# Patient Record
Sex: Female | Born: 1955 | Race: White | Hispanic: No | Marital: Married | Smoking: Former smoker
Health system: Southern US, Community
[De-identification: ages and names within clinical notes are randomized; demographics above are authoritative.]

## PROBLEM LIST (undated history)

## (undated) DIAGNOSIS — E119 Type 2 diabetes mellitus without complications: Secondary | ICD-10-CM

## (undated) DIAGNOSIS — K219 Gastro-esophageal reflux disease without esophagitis: Secondary | ICD-10-CM

## (undated) DIAGNOSIS — J309 Allergic rhinitis, unspecified: Secondary | ICD-10-CM

## (undated) DIAGNOSIS — E559 Vitamin D deficiency, unspecified: Secondary | ICD-10-CM

## (undated) DIAGNOSIS — G4730 Sleep apnea, unspecified: Secondary | ICD-10-CM

## (undated) DIAGNOSIS — D649 Anemia, unspecified: Secondary | ICD-10-CM

## (undated) DIAGNOSIS — Z8249 Family history of ischemic heart disease and other diseases of the circulatory system: Secondary | ICD-10-CM

## (undated) DIAGNOSIS — F32A Depression, unspecified: Secondary | ICD-10-CM

## (undated) DIAGNOSIS — E785 Hyperlipidemia, unspecified: Secondary | ICD-10-CM

## (undated) DIAGNOSIS — I10 Essential (primary) hypertension: Secondary | ICD-10-CM

## (undated) DIAGNOSIS — F329 Major depressive disorder, single episode, unspecified: Secondary | ICD-10-CM

## (undated) DIAGNOSIS — I2510 Atherosclerotic heart disease of native coronary artery without angina pectoris: Secondary | ICD-10-CM

## (undated) DIAGNOSIS — I209 Angina pectoris, unspecified: Secondary | ICD-10-CM

## (undated) DIAGNOSIS — M8580 Other specified disorders of bone density and structure, unspecified site: Secondary | ICD-10-CM

## (undated) DIAGNOSIS — K5090 Crohn's disease, unspecified, without complications: Secondary | ICD-10-CM

## (undated) DIAGNOSIS — M1990 Unspecified osteoarthritis, unspecified site: Secondary | ICD-10-CM

## (undated) HISTORY — DX: Hyperlipidemia, unspecified: E78.5

## (undated) HISTORY — DX: Other specified disorders of bone density and structure, unspecified site: M85.80

## (undated) HISTORY — DX: Family history of ischemic heart disease and other diseases of the circulatory system: Z82.49

## (undated) HISTORY — DX: Type 2 diabetes mellitus without complications: E11.9

## (undated) HISTORY — DX: Vitamin D deficiency, unspecified: E55.9

## (undated) HISTORY — DX: Anemia, unspecified: D64.9

## (undated) HISTORY — DX: Sleep apnea, unspecified: G47.30

## (undated) HISTORY — DX: Crohn's disease, unspecified, without complications: K50.90

## (undated) HISTORY — DX: Essential (primary) hypertension: I10

## (undated) HISTORY — PX: CARDIAC CATHETERIZATION: SHX172

## (undated) HISTORY — DX: Allergic rhinitis, unspecified: J30.9

## (undated) HISTORY — DX: Depression, unspecified: F32.A

## (undated) HISTORY — DX: Major depressive disorder, single episode, unspecified: F32.9

---

## 1998-05-05 ENCOUNTER — Ambulatory Visit (HOSPITAL_COMMUNITY): Admission: RE | Admit: 1998-05-05 | Discharge: 1998-05-05 | Payer: Self-pay | Admitting: Obstetrics and Gynecology

## 1998-05-08 ENCOUNTER — Ambulatory Visit (HOSPITAL_COMMUNITY): Admission: RE | Admit: 1998-05-08 | Discharge: 1998-05-08 | Payer: Self-pay | Admitting: Obstetrics and Gynecology

## 1999-05-26 ENCOUNTER — Encounter: Payer: Self-pay | Admitting: Obstetrics and Gynecology

## 1999-05-26 ENCOUNTER — Ambulatory Visit (HOSPITAL_COMMUNITY): Admission: RE | Admit: 1999-05-26 | Discharge: 1999-05-26 | Payer: Self-pay | Admitting: Obstetrics and Gynecology

## 1999-08-23 HISTORY — PX: CHOLECYSTECTOMY, LAPAROSCOPIC: SHX56

## 2000-07-04 ENCOUNTER — Ambulatory Visit (HOSPITAL_COMMUNITY): Admission: RE | Admit: 2000-07-04 | Discharge: 2000-07-04 | Payer: Self-pay | Admitting: Obstetrics and Gynecology

## 2000-07-04 ENCOUNTER — Encounter: Payer: Self-pay | Admitting: Obstetrics and Gynecology

## 2001-02-13 ENCOUNTER — Other Ambulatory Visit: Admission: RE | Admit: 2001-02-13 | Discharge: 2001-02-13 | Payer: Self-pay | Admitting: Obstetrics and Gynecology

## 2001-09-20 ENCOUNTER — Ambulatory Visit (HOSPITAL_COMMUNITY): Admission: RE | Admit: 2001-09-20 | Discharge: 2001-09-20 | Payer: Self-pay | Admitting: Obstetrics and Gynecology

## 2001-09-20 ENCOUNTER — Encounter: Payer: Self-pay | Admitting: Obstetrics and Gynecology

## 2002-04-29 ENCOUNTER — Other Ambulatory Visit: Admission: RE | Admit: 2002-04-29 | Discharge: 2002-04-29 | Payer: Self-pay | Admitting: Obstetrics and Gynecology

## 2002-05-13 ENCOUNTER — Emergency Department (HOSPITAL_COMMUNITY): Admission: EM | Admit: 2002-05-13 | Discharge: 2002-05-13 | Payer: Self-pay | Admitting: Emergency Medicine

## 2002-05-13 ENCOUNTER — Encounter: Payer: Self-pay | Admitting: Emergency Medicine

## 2002-05-17 ENCOUNTER — Observation Stay (HOSPITAL_COMMUNITY): Admission: RE | Admit: 2002-05-17 | Discharge: 2002-05-17 | Payer: Self-pay | Admitting: *Deleted

## 2002-05-17 ENCOUNTER — Encounter (INDEPENDENT_AMBULATORY_CARE_PROVIDER_SITE_OTHER): Payer: Self-pay | Admitting: Specialist

## 2002-10-07 ENCOUNTER — Ambulatory Visit (HOSPITAL_COMMUNITY): Admission: RE | Admit: 2002-10-07 | Discharge: 2002-10-07 | Payer: Self-pay | Admitting: Obstetrics and Gynecology

## 2002-10-07 ENCOUNTER — Encounter: Payer: Self-pay | Admitting: Obstetrics and Gynecology

## 2003-05-20 ENCOUNTER — Other Ambulatory Visit: Admission: RE | Admit: 2003-05-20 | Discharge: 2003-05-20 | Payer: Self-pay | Admitting: Obstetrics and Gynecology

## 2003-10-13 ENCOUNTER — Ambulatory Visit (HOSPITAL_COMMUNITY): Admission: RE | Admit: 2003-10-13 | Discharge: 2003-10-13 | Payer: Self-pay | Admitting: Obstetrics and Gynecology

## 2004-06-25 ENCOUNTER — Ambulatory Visit: Payer: Self-pay | Admitting: Internal Medicine

## 2004-07-23 ENCOUNTER — Ambulatory Visit: Payer: Self-pay | Admitting: Internal Medicine

## 2004-08-10 ENCOUNTER — Ambulatory Visit (HOSPITAL_COMMUNITY): Admission: RE | Admit: 2004-08-10 | Discharge: 2004-08-10 | Payer: Self-pay | Admitting: Internal Medicine

## 2004-10-08 ENCOUNTER — Ambulatory Visit: Payer: Self-pay | Admitting: Internal Medicine

## 2004-10-18 ENCOUNTER — Encounter: Admission: RE | Admit: 2004-10-18 | Discharge: 2005-01-16 | Payer: Self-pay | Admitting: Internal Medicine

## 2004-12-06 ENCOUNTER — Ambulatory Visit (HOSPITAL_COMMUNITY): Admission: RE | Admit: 2004-12-06 | Discharge: 2004-12-06 | Payer: Self-pay | Admitting: Obstetrics and Gynecology

## 2004-12-17 ENCOUNTER — Encounter: Admission: RE | Admit: 2004-12-17 | Discharge: 2004-12-17 | Payer: Self-pay | Admitting: Obstetrics and Gynecology

## 2006-07-12 ENCOUNTER — Encounter: Admission: RE | Admit: 2006-07-12 | Discharge: 2006-07-12 | Payer: Self-pay | Admitting: Family Medicine

## 2006-11-14 ENCOUNTER — Other Ambulatory Visit: Admission: RE | Admit: 2006-11-14 | Discharge: 2006-11-14 | Payer: Self-pay | Admitting: Family Medicine

## 2006-11-30 ENCOUNTER — Ambulatory Visit: Payer: Self-pay | Admitting: Cardiology

## 2006-12-06 ENCOUNTER — Ambulatory Visit: Payer: Self-pay

## 2006-12-26 ENCOUNTER — Ambulatory Visit: Payer: Self-pay | Admitting: Cardiology

## 2007-01-04 ENCOUNTER — Ambulatory Visit: Payer: Self-pay | Admitting: Cardiology

## 2007-01-04 LAB — CONVERTED CEMR LAB
ALT: 31 units/L (ref 0–40)
AST: 16 units/L (ref 0–37)
Bilirubin, Direct: 0.1 mg/dL (ref 0.0–0.3)
CO2: 28 meq/L (ref 19–32)
Calcium: 9.3 mg/dL (ref 8.4–10.5)
Chloride: 105 meq/L (ref 96–112)
Cholesterol: 171 mg/dL (ref 0–200)
Creatinine, Ser: 0.8 mg/dL (ref 0.4–1.2)
Glucose, Bld: 119 mg/dL — ABNORMAL HIGH (ref 70–99)
Total Bilirubin: 0.7 mg/dL (ref 0.3–1.2)
Total CHOL/HDL Ratio: 4
Total Protein: 7.2 g/dL (ref 6.0–8.3)

## 2007-02-14 ENCOUNTER — Ambulatory Visit: Payer: Self-pay | Admitting: Cardiology

## 2007-06-28 ENCOUNTER — Ambulatory Visit: Payer: Self-pay | Admitting: Cardiology

## 2007-07-23 ENCOUNTER — Ambulatory Visit: Payer: Self-pay | Admitting: Cardiology

## 2007-07-23 LAB — CONVERTED CEMR LAB
AST: 10 units/L (ref 0–37)
Albumin: 3.8 g/dL (ref 3.5–5.2)
Alkaline Phosphatase: 66 units/L (ref 39–117)
Total Bilirubin: 0.7 mg/dL (ref 0.3–1.2)

## 2007-09-26 ENCOUNTER — Encounter: Admission: RE | Admit: 2007-09-26 | Discharge: 2007-09-26 | Payer: Self-pay | Admitting: Family Medicine

## 2008-07-15 ENCOUNTER — Ambulatory Visit: Payer: Self-pay | Admitting: Cardiology

## 2008-07-15 LAB — CONVERTED CEMR LAB
AST: 20 units/L (ref 0–37)
Chloride: 106 meq/L (ref 96–112)
Creatinine, Ser: 0.7 mg/dL (ref 0.4–1.2)
GFR calc Af Amer: 112 mL/min
GFR calc non Af Amer: 93 mL/min
LDL Cholesterol: 93 mg/dL (ref 0–99)
Potassium: 4.6 meq/L (ref 3.5–5.1)
Total Bilirubin: 0.7 mg/dL (ref 0.3–1.2)
Total CHOL/HDL Ratio: 4.3
VLDL: 35 mg/dL (ref 0–40)

## 2008-08-28 ENCOUNTER — Ambulatory Visit: Payer: Self-pay | Admitting: Cardiology

## 2009-04-30 ENCOUNTER — Ambulatory Visit (HOSPITAL_COMMUNITY): Admission: RE | Admit: 2009-04-30 | Discharge: 2009-04-30 | Payer: Self-pay | Admitting: Family Medicine

## 2009-05-11 ENCOUNTER — Other Ambulatory Visit: Admission: RE | Admit: 2009-05-11 | Discharge: 2009-05-11 | Payer: Self-pay | Admitting: Family Medicine

## 2009-06-12 ENCOUNTER — Encounter (INDEPENDENT_AMBULATORY_CARE_PROVIDER_SITE_OTHER): Payer: Self-pay | Admitting: *Deleted

## 2009-09-03 ENCOUNTER — Encounter (INDEPENDENT_AMBULATORY_CARE_PROVIDER_SITE_OTHER): Payer: Self-pay | Admitting: *Deleted

## 2009-12-21 ENCOUNTER — Telehealth: Payer: Self-pay | Admitting: Internal Medicine

## 2010-04-22 HISTORY — PX: HEMORRHOID SURGERY: SHX153

## 2010-05-17 ENCOUNTER — Encounter: Payer: Self-pay | Admitting: Cardiology

## 2010-05-18 ENCOUNTER — Encounter: Payer: Self-pay | Admitting: Cardiology

## 2010-07-22 ENCOUNTER — Ambulatory Visit (HOSPITAL_COMMUNITY)
Admission: RE | Admit: 2010-07-22 | Discharge: 2010-07-22 | Payer: Self-pay | Source: Home / Self Care | Admitting: Family Medicine

## 2010-09-09 ENCOUNTER — Other Ambulatory Visit: Payer: Self-pay | Admitting: Dermatology

## 2010-09-12 ENCOUNTER — Encounter: Payer: Self-pay | Admitting: Family Medicine

## 2010-09-23 NOTE — Progress Notes (Signed)
Summary: Schedule Colonoscopy   Phone Note Outgoing Call Call back at [facility] Phone [phone]   Call placed by: Bernita Buffy CMA Deborra Medina),  Dec 21, 2009 2:54 PM Call placed to: Patient Summary of Call: Left message on patients machine to call back. pt needs colonoscopy for crohns.  Initial call taken by: Bernita Buffy CMA Deborra Medina),  Dec 21, 2009 2:54 PM  Follow-up for Phone Call        Left message on patients machine to call back. patient due for colonoscopy. we will mail her a letter as a reminder.  Follow-up by: Bernita Buffy CMA ([place]),  Jan 01, 2010 11:02 AM

## 2010-09-23 NOTE — Miscellaneous (Signed)
Summary: update med  Clinical Lists Changes  Medications: Added new medication of LISINOPRIL 20 MG TABS (LISINOPRIL) Take one tablet by mouth daily

## 2011-01-04 NOTE — Assessment & Plan Note (Signed)
Summer Hodge                            CARDIOLOGY OFFICE NOTE   Summer Hodge, Summer Hodge                       MRN:          [id number]  DATE:02/14/2007                            DOB:          1956/08/21    PRIMARY CARDIOLOGIST:  Summer Hodge. Summer Foyer, MD, [facility].   PRIMARY CARE Summer Hodge:  Summer Hodge, M.D.   PATIENT PROFILE:  A 55 year old Caucasian female with history of  hypertension, hyperlipidemia, and chest pain, status post recent  negative Myoview, who presents for followup on hypertension.   PROBLEMS:  1. Chest pain.      a.     On December 06, 2006, exercise Myoview.  Exercise time 9       minutes.  Maximum heart rate 151 beats per minute.  Hypertensive       response to exercise, with maximum blood pressure of 213/99.       Nonspecific ST-T wave changes.  EF 73%.  There was a fixed       anterior defect that is suggestive of breast attenuation.  No       ischemia.  Felt to be a limited study.  2. Hypertension.  3. Hyperlipidemia.      a.     On Jan 04, 2007, total cholesterol 171, triglycerides 164,       HDL 43.1, LDL 95.  LFTs were within normal limits at that time.  4. History of mild Crohn's disease.  5. Obstructive sleep apnea, on CPAP.  6. History of cesarean section in 1993.  7. Status post cholecystectomy in 2003.   HISTORY OF PRESENT ILLNESS:  A 55 year old Caucasian female, recently  evaluated by Dr. Lia Hodge for chest pain, with subsequent Myoview which  was negative.  She was noted during Myoview to have a hypertensive  response to exercise, and in followup was placed on lisinopril 10 mg  daily.  She has tracked her blood pressure intermittently over the past  month, with pressures ranging from a low of 116 to a high of 151V  systolically.  She has not had any recurrent chest pain or shortness of  breath.  She denies any PND, orthopnea, dizziness, syncope, edema, or  early satiety.   HOME MEDICATIONS:  1. Aspirin 81 mg daily.  2. Lisinopril 10 mg daily.  3. Crestor 10 mg daily.   PHYSICAL EXAMINATION:  VITAL SIGNS:  Blood pressure 138/87, heart rate  75, respirations 16, weight is 194 pounds, which is unchanged from one  month ago.  GENERAL:  Pleasant white female in no acute distress.  Awake, alert, and  oriented x3.  NECK:  Normal carotid upstrokes.  No bruits or JVD.  LUNGS:  Respirations regular and unlabored.  Clear to auscultation.  CARDIAC:  Regular S1, S2.  No S3, S4, or murmurs.  ABDOMEN:  Round, soft, nontender, nondistended.  Bowel sounds present  x4.  EXTREMITIES:  Warm, dry, pink.  No clubbing, cyanosis, or edema.  Dorsalis pedis and posterior tibial pulses 2+ and equal bilaterally.   ACCESSORY CLINICAL FINDINGS:  None.   ASSESSMENT AND PLAN:  1.  Chest pain.  The patient is status post negative Myoview.  She has      not had any recurrent symptoms.  2. Hypertension.  This is somewhat improved on lisinopril 10 mg.      However, there is still room for improvement.  Therefore, we have      given her a prescription for lisinopril 20 mg 1 p.o. daily, #30,      with 6 refills.  She had a BMET checked last month, and this was      within normal limits.  3. Hyperlipidemia.  She is on Crestor therapy now for roughly 2      months.  Her LFTs were normal a month ago, and her cholesterol      numbers were markedly improved, as outlined in the problem list.      Make no changes today.  4. Disposition.  The patient will follow up with Dr. Lia Hodge in      approximately 3 months or sooner if necessary.      Summer Hodge, ANP  Electronically Signed      Summer Hodge. Summer Foyer, MD, Summer Hodge  Electronically Signed   CB/MedQ  DD: 02/14/2007  DT: 02/15/2007  Job #: [id number]

## 2011-01-04 NOTE — Letter (Signed)
Dec 26, 2006    [name], M.D.  [address], [place] [address]   RE:  JANALYNN, EDER  MRN:  [id number]  /  DOB:  1956-03-20   Dear Butch Penny:   I had the pleasure of seeing Ms. Manolis in the office today in a followup  visit.  In general, we have started her on Crestor.  As you know, she  had an episode of chest pain.  She underwent radionuclide imaging which  demonstrated a small anterior defect that was stable and not thought to  be related to ischemia.  I suspect it is related to breast attenuation.  The remarkable finding was that of significant hypertension on her  stress test.  This was with low levels of activity.  Also, as you know,  she had a high LDL cholesterol and also a high CRP.  In this combination  we have recommended the initiation with Crestor and she has tolerated 5  mg without too much difficulty.  She came in today to talk about  hypertension management as well.   PHYSICAL EXAMINATION:  VITAL SIGNS:  Today on examination the BP was  140/94, pulse 72.  She was otherwise not examined.   She and I had a lengthy discussion about the clinical trials associated  with a variety of strategies.  I have recommended initiation with low-  dose ACE inhibition with Lisinopril 10 mg daily.  I have reviewed all  the potential side effects of the medications with her.  She will get a  follow-up basic metabolic profile in one week, and I have made her aware  of the rare potential complications associated with the drug therapy.  She will also get a lipid and liver profile.  Based on a recent JUPITER  study, it would make sense to potentially continue treating her with  Crestor at the present time.  My hope is that we will be able to get her  LDL down in time with  titration as opposed to aggressive management on the front end.  I will  see her back in followup in one month and follow up on these studies.   Thanks so much for allowing me to share in her  care.    Sincerely,      Loretha Brasil. Lia Foyer, MD, [facility]  Electronically Signed    TDS/MedQ  DD: 12/26/2006  DT: 12/26/2006  Job #: [id number]

## 2011-01-04 NOTE — Assessment & Plan Note (Signed)
[facility]                            CARDIOLOGY OFFICE NOTE   Summer Hodge                       MRN:          [id number]  DATE:08/28/2008                            DOB:          [birth year]    Summer Hodge is in for a followup visit.  She is stable.  She is not having  any major trouble.  She has had a nice holiday.  She is not having any  side effects from Crestor 5, but one can question really whether this is  doing much good for her.  Recent laboratory studies do reveal an LDL of  93, HDL of 39, total cholesterol 167, triglycerides 174.  The liver  function studies were normal.  Glucose was mildly elevated, and it was a  fasting specimen of 114.   PHYSICAL EXAMINATION:  VITAL SIGNS:  Her weight is 200 pounds when  compared to 2 years earlier, it is approximately 6 pounds higher.  The  blood pressure is 120/82, the pulse is 83.  LUNGS:  Fields are clear.  CARDIAC:  Rhythm is regular.  There is no S4 gallop.  EXTREMITIES:  No edema.   The electrocardiogram demonstrates normal sinus rhythm, essentially  within normal limits.   IMPRESSION:  1. Mild hypercholesterolemia.  2. Hypertension.  3. Prior episode of chest pain with myocardial perfusion study      demonstrating low-risk study with breast attenuation and ejection      fraction 73%.  4. Crohn disease.   PLAN:  1. Continue current medical regimen except discontinuation of Crestor      at the present time.  2. Repeat lipid and liver profile in approximately 2 months.     Loretha Brasil. Lia Foyer, MD, [facility]  Electronically Signed    TDS/MedQ  DD: 08/28/2008  DT: 08/29/2008  Job #: [id number]   cc:   [name], M.D.

## 2011-01-04 NOTE — Assessment & Plan Note (Signed)
[facility]                            CARDIOLOGY OFFICE NOTE   JALONI, SORBER                       MRN:          [id number]  DATE:06/28/2007                            DOB:          1956-02-15    Ms. Purrington is in for followup.  She is doing really quite well.  She is  not having any side effects whatsoever on the higher dose of Crestor.  She actually does quite well.   MEDICATIONS:  1. Aspirin 81 mg daily.  2. Crestor 10 mg daily.  3. Lisinopril 20 mg daily.   PHYSICAL EXAMINATION:  GENERAL:  She is alert and oriented.  VITAL SIGNS:  Blood pressure is 130/76, pulse 83.  LUNGS:  Lung fields clear.  CARDIAC:  Rhythm is regular.  EXTREMITIES:  Reveal no edema.   IMPRESSION:  1. Hypertension.  2. Hypercholesterolemia with significant elevations on a Lipo-Medi      profile of small LDL.   PLAN:  1. Return to clinic in 6 months.  2. Check lipid and liver profile.  3. Basic metabolic profile.  4. EKG.  EKG reveals normal sinus rhythm.  That is probably normal.      There is a delay in R wave progression which is likely related to      lead placement.     Loretha Brasil. Lia Foyer, MD, [facility]  Electronically Signed    TDS/MedQ  DD: 06/28/2007  DT: 06/28/2007  Job #: [id number]

## 2011-01-07 NOTE — Assessment & Plan Note (Signed)
[facility]                            CARDIOLOGY OFFICE NOTE   TORIAN, THOENNES                       MRN:          [id number]  DATE:11/30/2006                            DOB:          April 21, 1956    REFERRING PHYSICIAN:  [name], M.D.   CHIEF COMPLAINT:  Chest discomfort.   HISTORY OF PRESENT ILLNESS:  Ms. Coburn is a very delightful 55 year old  woman. About 2 to 3 months ago, she had 1 episode of chest discomfort.  With this, she felt like her arm went to sleep. The symptoms  subsequently resolved. They were fairly brief in terms of the duration.  She raised the question with her primary care physician about the  possibility of getting a chest CT. Her past history is remarkable for  some mild Crohn's disease and some mild hypertension. She has also been  recently diagnosed with sleep apnea and uses a CPAP machine. She does  have moderate fatigue.   PAST MEDICAL HISTORY:  Remarkable for a cesarean section in 1993,  cholecystectomy in 2003, and a history of sleep apnea, for which she  uses a CPAP.   ALLERGIES:  NO KNOWN DRUG ALLERGIES.   MEDICATIONS:  Remeron 30 mg q.h.s. p.r.n., aspirin 81 mg daily, and  intermittent calcium.   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 She is married. She has one 33 year old  son and is a non-smoker.   FAMILY HISTORY:  Father died at 19 of a myocardial infarction and had  type 2 diabetes and hypertension. Her mother died at 90 of pancreatic  cancer and had hypertension and diabetes. Grandfather died at 57 of a  myocardial infarction and a brother died at 23 of a myocardial  infarction. She has hypertension. She has a brother who is alive and has  hepatitis C.   REVIEW OF SYSTEMS:  The patient was treated by Dr. Caprice Beaver for some  depression. Had colonoscopy in 2005 with mild Crohn's disease. She has  intermittent diarrhea. The remainder of the  review of systems is  unremarkable.   PHYSICAL EXAMINATION:  GENERAL:  An alert, oriented female. The age is  23. Weight 194 pounds. She is 5 feet, 4 inches tall.  NECK:  There is no jugular venous distention.  CARDIOVASCULAR:  PMI is non-displaced. There is a normal first and  second heart sound without murmurs, rubs, or gallops.   LABORATORY DATA:  EKG reveals normal sinus rhythm with a low atrial  rhythm and no acute changes with non-specific T wave flattening.   IMPRESSION:  Somewhat atypical chest pain with hypercholesterolemia and  multiple cardiac risk factors without recurrence.   PLAN:  Exercise Cardiolite imaging. Followup in clinic in 1 month to  review the results. Review laboratory studies.     Loretha Brasil. Lia Foyer, MD, [facility]  Electronically Signed    TDS/MedQ  DD: 12/14/2006  DT: 12/14/2006  Job #: [id number]

## 2011-01-07 NOTE — Op Note (Signed)
NAME:  Summer Hodge, Summer Hodge                          ACCOUNT NO.:  [id number]   MEDICAL RECORD NO.:  [id number]                   PATIENT TYPE:  AMB   LOCATION:  DAY                                  FACILITY:  [facility]   PHYSICIAN:  Darrelyn Hillock, M.D.         DATE OF BIRTH:  09-Apr-1956   DATE OF PROCEDURE:  05/17/2002  DATE OF DISCHARGE:                                 OPERATIVE REPORT   PREOPERATIVE DIAGNOSIS:  Acute cholecystitis.   POSTOPERATIVE DIAGNOSIS:  Acute cholecystitis.   PROCEDURE:  Laparoscopic cholecystectomy with intraoperative cholangiogram.   SURGEON:  Darrelyn Hillock, M.D.   ASSISTANT:  Earnstine Regal, M.D.   ANESTHESIA:  General.   DESCRIPTION OF PROCEDURE:  The patient was taken to the operating room and  placed in a supine position.  After adequate anesthesia was induced using  endotracheal tube, the abdomen was prepped and draped in the normal sterile  fashion.  Using a transverse infraumbilical incision, I dissected down to  the fascia.  Fascia was opened vertically.  An 0 Vicryl pursestring suture  was placed around the fascial defect.  Hasson trocar was placed in the  abdomen, and the abdomen was insufflated with carbon dioxide.  Under direct  visualization, a 10 mm port was placed in the subxiphoid region, and two 5  mm ports were placed in the right abdomen.  Gallbladder was identified, was  very edematous and taut, and using the cyst aspirator, was decompressed.  It  was then grasped and retracted cephalad.  The infundibulum of the  gallbladder was identified.  Again, it was very inflamed, and it was a  difficult dissection.  But the cystic duct was identified; good window was  created posterior to it.  A clip was placed proximally up on the  gallbladder.  Small ductotomy was made in the cystic duct, and cholangiogram  was performed.  This showed normal filling of the common bile duct which was  dilated but tapered nicely and emptied into the  duodenum.  Both right and  left hepatic ducts were visualized.  There were no filling defects.  Reddick  catheter was then removed.  Cystic duct was triply clipped and divided.  Cystic artery was identified, triply clipped and divided.  Gallbladder,  which had virtually no mesentery, was slowly dissected off the gallbladder  fossa and removed through the umbilical port.  There was some bleeding of  the liver bed which was coagulated, and Surgicel was placed.  Adequate  hemostasis was noted and assured.  Pneumoperitoneum was released.  Infraumbilical fascial defect was closed with the 0 Vicryl pursestring  suture.  Skin incisions were closed with subcuticular 4-0 Monocryl.  Steri-  Strips and sterile dressings were applied.  The patient tolerated the  procedure well and went to PACU in good condition.  Darrelyn Hillock, M.D.    KRH/MEDQ  D:  05/17/2002  T:  05/17/2002  Job:  [id number]

## 2011-11-16 ENCOUNTER — Other Ambulatory Visit (HOSPITAL_COMMUNITY): Payer: Self-pay | Admitting: Family Medicine

## 2011-11-16 DIAGNOSIS — Z1231 Encounter for screening mammogram for malignant neoplasm of breast: Secondary | ICD-10-CM

## 2011-12-13 ENCOUNTER — Ambulatory Visit (HOSPITAL_COMMUNITY)
Admission: RE | Admit: 2011-12-13 | Discharge: 2011-12-13 | Disposition: A | Discharge status: Home / Self Care | Payer: BC Managed Care – PPO | Source: Ambulatory Visit | Attending: Family Medicine | Admitting: Family Medicine

## 2011-12-13 DIAGNOSIS — Z1231 Encounter for screening mammogram for malignant neoplasm of breast: Secondary | ICD-10-CM | POA: Insufficient documentation

## 2012-01-24 ENCOUNTER — Encounter: Payer: Self-pay | Admitting: Internal Medicine

## 2013-05-17 ENCOUNTER — Emergency Department (INDEPENDENT_AMBULATORY_CARE_PROVIDER_SITE_OTHER): Payer: BC Managed Care – PPO

## 2013-05-17 ENCOUNTER — Encounter (HOSPITAL_COMMUNITY): Payer: Self-pay | Admitting: *Deleted

## 2013-05-17 ENCOUNTER — Emergency Department (HOSPITAL_COMMUNITY)
Admission: EM | Admit: 2013-05-17 | Discharge: 2013-05-17 | Disposition: A | Discharge status: Home / Self Care | Payer: BC Managed Care – PPO | Source: Home / Self Care | Attending: Emergency Medicine | Admitting: Emergency Medicine

## 2013-05-17 DIAGNOSIS — R110 Nausea: Secondary | ICD-10-CM

## 2013-05-17 DIAGNOSIS — M546 Pain in thoracic spine: Secondary | ICD-10-CM

## 2013-05-17 DIAGNOSIS — M5412 Radiculopathy, cervical region: Secondary | ICD-10-CM

## 2013-05-17 LAB — POCT I-STAT, CHEM 8
BUN: 11 mg/dL (ref 6–23)
Calcium, Ion: 1.21 mmol/L (ref 1.12–1.23)
Chloride: 107 mEq/L (ref 96–112)
HCT: 44 % (ref 36.0–46.0)
Potassium: 4.5 mEq/L (ref 3.5–5.1)

## 2013-05-17 LAB — D-DIMER, QUANTITATIVE: D-Dimer, Quant: <0.27 ug/mL-FEU (ref 0.00–0.48)

## 2013-05-17 NOTE — ED Provider Notes (Signed)
Chief Complaint:   Chief Complaint  Patient presents with  . Back Pain    History of Present Illness:   Summer Hodge is a 57 year old female who has had a two-week history of upper back pain and nausea and a three-day history of right arm pain. She has an appointment with her primary care physician on Monday, 4 days from now, but became concerned and so came to the Urgent Care Center. The back pain has been going on for 2 weeks, is localized to the upper back, between the shoulder blade area, is bilateral, and comes and goes. It is rated a 4-5/10 in intensity and is worse with movement although there is nothing particular that brings it on or makes it go away. She denies any injury to the area and denies any lower back pain. She's also had nausea that comes and goes for the past 2 weeks. She denies any vomiting or abdominal pain. She's had no fever, chills, diarrhea, or constipation. She does have a history of Crohn's disease. For the past 3 days she's had pain radiating down her right arm. This seemed to begin the antecubital fossa and comes in waves. There is some numbness in the hand. She denies any weakness. She's had no fever, chills, URI symptoms, coughing, wheezing, shortness of breath, or anterior chest pain. She denies any abdominal pain, vomiting, diarrhea, constipation, blood in the stool, or urinary symptoms.  Review of Systems:  Other than noted above, the patient denies any of the following symptoms. Systemic:  No fever, chills, sweats, or fatigue. ENT:  No nasal congestion, rhinorrhea, or sore throat. Pulmonary:  No cough, wheezing, shortness of breath, sputum production, hemoptysis. Cardiac:  No palpitations, rapid heartbeat, dizziness, presyncope or syncope. GI:  No abdominal pain, heartburn, nausea, or vomiting. Ext:  No leg pain or swelling.  PMFSH:  Past medical history, family history, social history, meds, and allergies were reviewed and updated as needed. She has a history of  Crohn's disease and a history of "borderline diabetes," although she has never taken any medication for this. Her only medication is aspirin.  Physical Exam:   Vital signs:  BP 184/85  Pulse 78  Temp(Src) 99.1 F (37.3 C) (Oral)  Resp 20  SpO2 100% Gen:  Alert, oriented, in no distress, skin warm and dry. Eye:  PERRL, lids and conjunctivas normal.  Sclera non-icteric. ENT:  Mucous membranes moist, pharynx clear. Neck:  Supple, no adenopathy or tenderness.  No JVD. Lungs:  Clear to auscultation, no wheezes, rales or rhonchi.  No respiratory distress. Heart:  Regular rhythm.  No gallops, murmers, clicks or rubs. Chest:  No chest wall tenderness. She has mild tenderness to palpation her upper back between her shoulder blades. Abdomen:  Soft, nontender, no organomegaly or mass.  Bowel sounds normal.  No pulsatile abdominal mass or bruit. Ext:  No edema.  No calf tenderness and Homann's sign negative.  Pulses full and equal. Skin:  Warm and dry.  No rash.  Labs:   Results for orders placed during the hospital encounter of 05/17/13  D-DIMER, QUANTITATIVE      Result Value Range   D-Dimer, Quant <0.27  0.00 - 0.48 ug/mL-FEU  POCT I-STAT, CHEM 8      Result Value Range   Sodium 141  135 - 145 mEq/L   Potassium 4.5  3.5 - 5.1 mEq/L   Chloride 107  96 - 112 mEq/L   BUN 11  6 - 23 mg/dL  Creatinine, Ser 0.90  0.50 - 1.10 mg/dL   Glucose, Bld 92  70 - 99 mg/dL   Calcium, Ion 1.61  0.96 - 1.23 mmol/L   TCO2 26  0 - 100 mmol/L   Hemoglobin 15.0  12.0 - 15.0 g/dL   HCT 04.5  40.9 - 81.1 %     Radiology:  Dg Chest 2 View  05/17/2013   CLINICAL DATA:  Upper back pain. Nausea and right arm pain  EXAM: CHEST  2 VIEW  COMPARISON:  None.  FINDINGS: The heart size and mediastinal contours are within normal limits. Both lungs are clear. The visualized skeletal structures are unremarkable.  IMPRESSION: No active cardiopulmonary disease.   Electronically Signed   By: Signa Kell M.D.   On:  05/17/2013 16:33   I reviewed the images independently and personally and concur with the radiologist's findings.  EKG:   Date: 05/17/2013  Rate: 69  Rhythm: normal sinus rhythm  QRS Axis: normal  Intervals: normal  ST/T Wave abnormalities: normal  Conduction Disutrbances:none  Narrative Interpretation: Normal sinus rhythm, normal EKG.  Old EKG Reviewed: none available  Assessment:  The primary encounter diagnosis was Cervical radiculopathy. Diagnoses of Thoracic back pain and Nausea were also pertinent to this visit.  Back pain and right arm pain are most consistent with cervical radiculopathy. I recommended anti-inflammatories, muscle relaxers, and pain medications, but the patient wants to try just exercises for right now. She has an appointment Monday with her primary care physician we'll keep this. I'm not certain of how the nausea figures into this, this may be something else, possibly a viral gastroenteritis or a manifestation of her Crohn's disease.  Plan:   1.  Meds:  The following meds were prescribed:   Discharge Medication List as of 05/17/2013  6:27 PM      2.  Patient Education/Counseling:  The patient was given appropriate handouts, self care instructions, and instructed in symptomatic relief.  Recommended exercises for the neck and a light diet.  3.  Follow up:  The patient was told to follow up if no better in 3 to 4 days, if becoming worse in any way, and give an an some red flag symptoms such as worsening pain or new neurological symptoms, fever, chills, shortness of breath, or chest pain which would prompt immediate return.  Follow up here or with her primary care physician as scheduled next week.     Reuben Likes, MD 05/17/13 2219

## 2013-05-17 NOTE — ED Notes (Signed)
Pt  Reports  r  Upper  Back  Pain  With  Nausea         X  3  Days  She  denys  Any  Chest  Pain  howver  Reports  r  Arm  Pain that is  Pretty  Much  Constant           She  Reports  Diabetes  As  Well as high  Blood pressure       But takes  No  meds

## 2013-05-20 ENCOUNTER — Other Ambulatory Visit: Payer: Self-pay | Admitting: Family Medicine

## 2013-05-20 ENCOUNTER — Other Ambulatory Visit (HOSPITAL_COMMUNITY)
Admission: RE | Admit: 2013-05-20 | Discharge: 2013-05-20 | Disposition: A | Discharge status: Home / Self Care | Payer: BC Managed Care – PPO | Source: Ambulatory Visit | Attending: Family Medicine | Admitting: Family Medicine

## 2013-05-20 DIAGNOSIS — Z1151 Encounter for screening for human papillomavirus (HPV): Secondary | ICD-10-CM | POA: Insufficient documentation

## 2013-05-20 DIAGNOSIS — Z124 Encounter for screening for malignant neoplasm of cervix: Secondary | ICD-10-CM | POA: Insufficient documentation

## 2013-05-22 ENCOUNTER — Other Ambulatory Visit: Payer: Self-pay | Admitting: Family Medicine

## 2013-05-22 DIAGNOSIS — R110 Nausea: Secondary | ICD-10-CM

## 2013-05-22 DIAGNOSIS — R1013 Epigastric pain: Secondary | ICD-10-CM

## 2013-05-28 ENCOUNTER — Other Ambulatory Visit: Payer: BC Managed Care – PPO

## 2013-05-31 ENCOUNTER — Ambulatory Visit
Admission: RE | Admit: 2013-05-31 | Discharge: 2013-05-31 | Disposition: A | Discharge status: Home / Self Care | Payer: BC Managed Care – PPO | Source: Ambulatory Visit | Attending: Family Medicine | Admitting: Family Medicine

## 2013-05-31 DIAGNOSIS — R1013 Epigastric pain: Secondary | ICD-10-CM

## 2013-05-31 DIAGNOSIS — R110 Nausea: Secondary | ICD-10-CM

## 2013-06-27 ENCOUNTER — Other Ambulatory Visit: Payer: Self-pay

## 2013-07-02 ENCOUNTER — Encounter: Payer: Self-pay | Admitting: Cardiology

## 2013-08-13 ENCOUNTER — Ambulatory Visit
Admission: RE | Admit: 2013-08-13 | Discharge: 2013-08-13 | Disposition: A | Discharge status: Home / Self Care | Payer: BC Managed Care – PPO | Source: Ambulatory Visit | Attending: Family Medicine | Admitting: Family Medicine

## 2013-08-13 ENCOUNTER — Other Ambulatory Visit: Payer: Self-pay | Admitting: Family Medicine

## 2013-08-13 DIAGNOSIS — R0781 Pleurodynia: Secondary | ICD-10-CM

## 2013-08-27 ENCOUNTER — Other Ambulatory Visit (HOSPITAL_COMMUNITY): Payer: Self-pay | Admitting: Family Medicine

## 2013-08-27 DIAGNOSIS — Z1231 Encounter for screening mammogram for malignant neoplasm of breast: Secondary | ICD-10-CM

## 2013-09-05 ENCOUNTER — Ambulatory Visit (HOSPITAL_COMMUNITY)
Admission: RE | Admit: 2013-09-05 | Discharge: 2013-09-05 | Disposition: A | Discharge status: Home / Self Care | Payer: BC Managed Care – PPO | Source: Ambulatory Visit | Attending: Family Medicine | Admitting: Family Medicine

## 2013-09-05 DIAGNOSIS — Z1231 Encounter for screening mammogram for malignant neoplasm of breast: Secondary | ICD-10-CM

## 2013-12-05 ENCOUNTER — Other Ambulatory Visit: Payer: Self-pay | Admitting: Dermatology

## 2015-04-28 ENCOUNTER — Other Ambulatory Visit (HOSPITAL_COMMUNITY): Payer: Self-pay | Admitting: Family Medicine

## 2015-04-28 DIAGNOSIS — Z1231 Encounter for screening mammogram for malignant neoplasm of breast: Secondary | ICD-10-CM

## 2015-05-01 ENCOUNTER — Ambulatory Visit (HOSPITAL_COMMUNITY)
Admission: RE | Admit: 2015-05-01 | Discharge: 2015-05-01 | Disposition: A | Discharge status: Home / Self Care | Payer: BLUE CROSS/BLUE SHIELD | Source: Ambulatory Visit | Attending: Family Medicine | Admitting: Family Medicine

## 2015-05-01 DIAGNOSIS — Z1231 Encounter for screening mammogram for malignant neoplasm of breast: Secondary | ICD-10-CM | POA: Diagnosis not present

## 2016-06-09 ENCOUNTER — Other Ambulatory Visit: Payer: Self-pay | Admitting: Family Medicine

## 2016-06-09 ENCOUNTER — Other Ambulatory Visit (HOSPITAL_COMMUNITY)
Admission: RE | Admit: 2016-06-09 | Discharge: 2016-06-09 | Disposition: A | Discharge status: Home / Self Care | Payer: BLUE CROSS/BLUE SHIELD | Source: Ambulatory Visit | Attending: Family Medicine | Admitting: Family Medicine

## 2016-06-09 DIAGNOSIS — Z01411 Encounter for gynecological examination (general) (routine) with abnormal findings: Secondary | ICD-10-CM | POA: Diagnosis not present

## 2016-06-13 LAB — CYTOLOGY - PAP: Diagnosis: NEGATIVE

## 2016-07-07 ENCOUNTER — Ambulatory Visit (INDEPENDENT_AMBULATORY_CARE_PROVIDER_SITE_OTHER): Payer: BLUE CROSS/BLUE SHIELD | Admitting: Cardiology

## 2016-07-07 ENCOUNTER — Encounter: Payer: Self-pay | Admitting: Cardiology

## 2016-07-07 VITALS — BP 130/88 | HR 79 | Ht 60.0 in | Wt 188.0 lb

## 2016-07-07 DIAGNOSIS — E7800 Pure hypercholesterolemia, unspecified: Secondary | ICD-10-CM

## 2016-07-07 DIAGNOSIS — R0600 Dyspnea, unspecified: Secondary | ICD-10-CM | POA: Diagnosis not present

## 2016-07-07 DIAGNOSIS — I10 Essential (primary) hypertension: Secondary | ICD-10-CM

## 2016-07-07 DIAGNOSIS — E66811 Obesity, class 1: Secondary | ICD-10-CM

## 2016-07-07 DIAGNOSIS — E119 Type 2 diabetes mellitus without complications: Secondary | ICD-10-CM | POA: Diagnosis not present

## 2016-07-07 DIAGNOSIS — E6609 Other obesity due to excess calories: Secondary | ICD-10-CM | POA: Diagnosis not present

## 2016-07-07 MED ORDER — ROSUVASTATIN CALCIUM 5 MG PO TABS: 5.0000 mg | ORAL_TABLET | ORAL | Status: DC

## 2016-07-07 NOTE — Patient Instructions (Signed)
Medication Instructions:  Please take Crestor 5 mg once a week. Continue all other medications as listed.  Testing/Procedures: Your physician has requested that you have a  myoview. For further information please visit [email or web address]. Please follow instruction sheet, as given.  Follow-Up: Follow up as needed after testing.  Thank you for choosing [facility]!!

## 2016-07-07 NOTE — Progress Notes (Signed)
Cardiology Office Note    Date:  07/07/2016   ID:  Summer Hodge, DOB [birth year], MRN [id number]  PCP:  No PCP Per Patient  Cardiologist:   Candee Furbish, MD     History of Present Illness:  Summer Hodge is a 60 y.o. female here for evaluation of chest pain. She has risk factors of diabetes, hyperlipidemia, hypertension as well as a brother who died of myocardial infarction at age 1. She had another brother who died at age 59 with myocardial infarction as well. Many years ago she had a nuclear stress test that was low risk, Dr. Lia Foyer.  More dyspnea with flight of stairs. Dizziness sudden. Fluttering sensation in throat, 3-4 min. Pain in right arm. No chest pain.   In the past Crestor caused muscle aches, Lipitor causes muscle aches and pravastatin cause muscle aches.  Past Medical History:  Diagnosis Date  . Allergic rhinitis   . Anemia   . Crohn disease ([place])   . Depression   . Diabetes mellitus without complication ([place])   . Family history of heart disease   . Hyperlipidemia   . Hypertension   . Osteopenia   . Sleep apnea   . Vitamin D deficiency     Past Surgical History:  Procedure Laterality Date  . CESAREAN SECTION    . CHOLECYSTECTOMY, LAPAROSCOPIC  2001  . HEMORRHOID SURGERY  04/2010    Current Medications: Outpatient Medications Prior to Visit  Medication Sig Dispense Refill  . aspirin 81 MG tablet Take 81 mg by mouth daily.    Marland Kitchen lisinopril (PRINIVIL,ZESTRIL) 5 MG tablet Take 5 mg by mouth daily.     No facility-administered medications prior to visit.      Allergies:   Crestor [rosuvastatin]; Lipitor [atorvastatin]; and Pravastatin   Social History   Social History  . Marital status: Married    Spouse name: N/A  . Number of children: N/A  . Years of education: N/A   Social History Main Topics  . Smoking status: Former Smoker    Types: Cigarettes    Quit date: 06/23/1975  . Smokeless tobacco: Never Used     Comment: SMOKED BRIEFLY IN  EARLY 20'S  . Alcohol use Yes     Comment: 1 BEER OR WINE 3-4 X A WEEK  . Drug use: No  . Sexual activity: Yes    Partners: Male     Comment: RARELY   Other Topics Concern  . None   Social History Narrative  . None     Family History:  The patient's family history includes CAD in her brother, brother, brother, brother, father, and paternal grandfather; Cancer in her mother; Diabetes in her father and mother; Healthy in her son; Heart attack in her brother, brother, and paternal grandfather; Heart disease in her brother; Hepatitis C in her brother; Hypertension in her brother, brother, and mother; Hypotension in her father; Mental illness in her brother.   ROS:   Please see the history of present illness.    ROS All other systems reviewed and are negative.   PHYSICAL EXAM:   VS:  BP 130/88   Pulse 79   Ht 5' (1.524 m)   Wt 188 lb (85.3 kg)   LMP  (LMP Unknown)   BMI 36.72 kg/m    GEN: Well nourished, well developed, in no acute distress  HEENT: normal  Neck: no JVD, carotid bruits, or masses Cardiac: RRR; no murmurs, rubs, or gallops,no edema  Respiratory:  clear to auscultation bilaterally, normal work of breathing GI: soft, nontender, nondistended, + BS, overweight MS: no deformity or atrophy  Skin: warm and dry, no rash Neuro:  Alert and Oriented x 3, Strength and sensation are intact Psych: euthymic mood, full affect  Wt Readings from Last 3 Encounters:  07/07/16 188 lb (85.3 kg)      Studies/Labs Reviewed:   EKG:  EKG is ordered today.  The ekg ordered today demonstrates 07/07/16-sinus rhythm, 79, no other abnormalities personally viewed.  Recent Labs: No results found for requested labs within last 8760 hours.   Lipid Panel    Component Value Date/Time   CHOL 167 07/15/2008 0818   TRIG 174 (H) 07/15/2008 0818   HDL 39.2 07/15/2008 0818   CHOLHDL 4.3 CALC 07/15/2008 0818   VLDL 35 07/15/2008 0818   LDLCALC 93 07/15/2008 0818    Additional studies/  records that were reviewed today include:  Prior office notes reviewed, prior nuclear stress test the early 1990 mid 1990s was low risk, normal EF    ASSESSMENT:    1. Dyspnea, unspecified type   2. Diabetes mellitus without complication ([place])   3. Essential hypertension   4. Class 1 obesity due to excess calories without serious comorbidity in adult, unspecified BMI   5. Pure hypercholesterolemia      PLAN:  In order of problems listed above:  Dyspnea  - Could be anginal equivalent.  - We will check a nuclear stress test, she should be able to walk on the treadmill she states.   Hyperlipidemia  - LDL 179 at one point.  - Many years ago she used to take Crestor 5 mg once a day with response, LDL to the upper 90s. She has not been able to take this recently however on a daily basis.  - I encouraged her to try Crestor 5 mg once a week. Continue with coenzyme Q10  - If she is able to tolerate once a week, then try twice weekly.  - I also discussed that we can set her up with the lipid clinic here if she desires.  - She would not likely qualify for [place] from his insurance aspect.  Diabetes mellitus  - Coronary artery disease equivalent.  - Explained to her that all comers with diabetes deserved to be on statin medication regardless of cholesterol levels.  Essential hypertension  - Dr. Darcus Austin recently increased her lisinopril. Blood pressures have improved.  Fluttering sensation in throat  - Could be PVCs or PACs.  - No high-risk symptoms such as syncope.    Medication Adjustments/Labs and Tests Ordered: Current medicines are reviewed at length with the patient today.  Concerns regarding medicines are outlined above.  Medication changes, Labs and Tests ordered today are listed in the Patient Instructions below. Patient Instructions  Medication Instructions:  Please take Crestor 5 mg once a week. Continue all other medications as listed.  Testing/Procedures: Your  physician has requested that you have a  myoview. For further information please visit [email or web address]. Please follow instruction sheet, as given.  Follow-Up: Follow up as needed after testing.  Thank you for choosing [facility]!!        Signed, Candee Furbish, MD  07/07/2016 12:17 PM    [address], [place], [place]  [address] Phone: [phone]; Fax: [phone]

## 2016-07-18 ENCOUNTER — Telehealth (HOSPITAL_COMMUNITY): Payer: Self-pay | Admitting: *Deleted

## 2016-07-18 NOTE — Telephone Encounter (Signed)
Patient given detailed instructions per Myocardial Perfusion Study Information Sheet for the test on 07/20/16 at 0745. Patient notified to arrive 15 minutes early and that it is imperative to arrive on time for appointment to keep from having the test rescheduled.  If you need to cancel or reschedule your appointment, please call the office within 24 hours of your appointment. Failure to do so may result in a cancellation of your appointment, and a $50 no show fee. Patient verbalized understanding.Chalisa Kobler, Ranae [place]

## 2016-07-20 ENCOUNTER — Ambulatory Visit (HOSPITAL_COMMUNITY): Payer: BLUE CROSS/BLUE SHIELD | Attending: Cardiology

## 2016-07-20 DIAGNOSIS — R0600 Dyspnea, unspecified: Secondary | ICD-10-CM

## 2016-07-20 LAB — MYOCARDIAL PERFUSION IMAGING
CHL CUP NUCLEAR SRS: 0
CHL CUP NUCLEAR SSS: 3
CHL CUP RESTING HR STRESS: 65 {beats}/min
CSEPED: 9 min
CSEPHR: 95 %
CSEPPHR: 151 {beats}/min
Estimated workload: 10.1 METS
Exercise duration (sec): 0 s
LV dias vol: 59 mL (ref 46–106)
LV sys vol: 14 mL
MPHR: 158 {beats}/min
NUC STRESS TID: 0.99
RATE: 0.33
SDS: 3

## 2016-07-20 MED ORDER — TECHNETIUM TC 99M TETROFOSMIN IV KIT
31.9000 | PACK | Freq: Once | INTRAVENOUS | Status: AC | PRN
  Administered 2016-07-20: 31.9 via INTRAVENOUS
  Filled 2016-07-20: qty 32

## 2016-07-20 MED ORDER — TECHNETIUM TC 99M TETROFOSMIN IV KIT
10.5000 | PACK | Freq: Once | INTRAVENOUS | Status: AC | PRN
  Administered 2016-07-20: 10.5 via INTRAVENOUS
  Filled 2016-07-20: qty 11

## 2016-07-26 ENCOUNTER — Encounter: Payer: Self-pay | Admitting: Cardiology

## 2016-07-26 DIAGNOSIS — E785 Hyperlipidemia, unspecified: Secondary | ICD-10-CM | POA: Diagnosis not present

## 2016-09-20 ENCOUNTER — Other Ambulatory Visit: Payer: Self-pay | Admitting: Family Medicine

## 2016-09-20 DIAGNOSIS — Z1231 Encounter for screening mammogram for malignant neoplasm of breast: Secondary | ICD-10-CM

## 2016-09-21 DIAGNOSIS — D1801 Hemangioma of skin and subcutaneous tissue: Secondary | ICD-10-CM | POA: Diagnosis not present

## 2016-09-21 DIAGNOSIS — L814 Other melanin hyperpigmentation: Secondary | ICD-10-CM | POA: Diagnosis not present

## 2016-09-21 DIAGNOSIS — D225 Melanocytic nevi of trunk: Secondary | ICD-10-CM | POA: Diagnosis not present

## 2016-09-21 DIAGNOSIS — Z86018 Personal history of other benign neoplasm: Secondary | ICD-10-CM | POA: Diagnosis not present

## 2016-10-17 ENCOUNTER — Ambulatory Visit
Admission: RE | Admit: 2016-10-17 | Discharge: 2016-10-17 | Disposition: A | Discharge status: Home / Self Care | Payer: BLUE CROSS/BLUE SHIELD | Source: Ambulatory Visit | Attending: Family Medicine | Admitting: Family Medicine

## 2016-10-17 DIAGNOSIS — Z1231 Encounter for screening mammogram for malignant neoplasm of breast: Secondary | ICD-10-CM

## 2016-12-19 DIAGNOSIS — I10 Essential (primary) hypertension: Secondary | ICD-10-CM | POA: Diagnosis not present

## 2016-12-19 DIAGNOSIS — E559 Vitamin D deficiency, unspecified: Secondary | ICD-10-CM | POA: Diagnosis not present

## 2016-12-19 DIAGNOSIS — E785 Hyperlipidemia, unspecified: Secondary | ICD-10-CM | POA: Diagnosis not present

## 2016-12-19 DIAGNOSIS — E119 Type 2 diabetes mellitus without complications: Secondary | ICD-10-CM | POA: Diagnosis not present

## 2016-12-26 DIAGNOSIS — G4733 Obstructive sleep apnea (adult) (pediatric): Secondary | ICD-10-CM | POA: Diagnosis not present

## 2017-01-09 DIAGNOSIS — G4733 Obstructive sleep apnea (adult) (pediatric): Secondary | ICD-10-CM | POA: Diagnosis not present

## 2017-02-21 DIAGNOSIS — Z5181 Encounter for therapeutic drug level monitoring: Secondary | ICD-10-CM | POA: Diagnosis not present

## 2017-02-21 DIAGNOSIS — E7800 Pure hypercholesterolemia, unspecified: Secondary | ICD-10-CM | POA: Diagnosis not present

## 2017-03-21 DIAGNOSIS — G4733 Obstructive sleep apnea (adult) (pediatric): Secondary | ICD-10-CM | POA: Diagnosis not present

## 2017-03-22 DIAGNOSIS — G4733 Obstructive sleep apnea (adult) (pediatric): Secondary | ICD-10-CM | POA: Diagnosis not present

## 2017-04-10 DIAGNOSIS — H2513 Age-related nuclear cataract, bilateral: Secondary | ICD-10-CM | POA: Diagnosis not present

## 2017-04-10 DIAGNOSIS — E119 Type 2 diabetes mellitus without complications: Secondary | ICD-10-CM | POA: Diagnosis not present

## 2017-04-10 DIAGNOSIS — H5213 Myopia, bilateral: Secondary | ICD-10-CM | POA: Diagnosis not present

## 2017-05-25 DIAGNOSIS — H2512 Age-related nuclear cataract, left eye: Secondary | ICD-10-CM | POA: Diagnosis not present

## 2017-05-25 DIAGNOSIS — H25812 Combined forms of age-related cataract, left eye: Secondary | ICD-10-CM | POA: Diagnosis not present

## 2017-06-01 DIAGNOSIS — H25811 Combined forms of age-related cataract, right eye: Secondary | ICD-10-CM | POA: Diagnosis not present

## 2017-06-01 DIAGNOSIS — H2511 Age-related nuclear cataract, right eye: Secondary | ICD-10-CM | POA: Diagnosis not present

## 2017-06-28 DIAGNOSIS — I10 Essential (primary) hypertension: Secondary | ICD-10-CM | POA: Diagnosis not present

## 2017-06-28 DIAGNOSIS — Z23 Encounter for immunization: Secondary | ICD-10-CM | POA: Diagnosis not present

## 2017-06-28 DIAGNOSIS — Z0000 Encounter for general adult medical examination without abnormal findings: Secondary | ICD-10-CM | POA: Diagnosis not present

## 2017-06-28 DIAGNOSIS — E119 Type 2 diabetes mellitus without complications: Secondary | ICD-10-CM | POA: Diagnosis not present

## 2017-06-28 DIAGNOSIS — E785 Hyperlipidemia, unspecified: Secondary | ICD-10-CM | POA: Diagnosis not present

## 2017-07-05 DIAGNOSIS — H9313 Tinnitus, bilateral: Secondary | ICD-10-CM | POA: Diagnosis not present

## 2017-07-05 DIAGNOSIS — H903 Sensorineural hearing loss, bilateral: Secondary | ICD-10-CM | POA: Diagnosis not present

## 2017-09-26 DIAGNOSIS — Z86018 Personal history of other benign neoplasm: Secondary | ICD-10-CM | POA: Diagnosis not present

## 2017-09-26 DIAGNOSIS — L814 Other melanin hyperpigmentation: Secondary | ICD-10-CM | POA: Diagnosis not present

## 2017-09-26 DIAGNOSIS — D225 Melanocytic nevi of trunk: Secondary | ICD-10-CM | POA: Diagnosis not present

## 2017-09-26 DIAGNOSIS — D1801 Hemangioma of skin and subcutaneous tissue: Secondary | ICD-10-CM | POA: Diagnosis not present

## 2017-09-27 IMAGING — NM NM MISC PROCEDURE
3 series · 18 of 18 positions shown · non-contrast
Comparison: none

[Series 1: wbr_s-proj_st stress_(id)_sa · 6.5mm · 6.51mm/px · 6 of 512 frames shown (1 of 2)]
[frame 43/512]
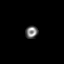
[frame 128/512]
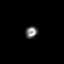
[frame 214/512]
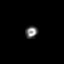
[frame 299/512]
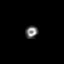
[frame 384/512]
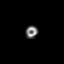
[frame 470/512]
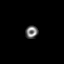

[Series 1: wbr_r-proj_st rest_(id)_sa · 6.5mm · 6.51mm/px · 6 of 64 frames shown]
[frame 6/64]
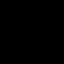
[frame 16/64]
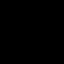
[frame 27/64]
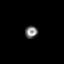
[frame 38/64]
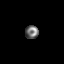
[frame 48/64]
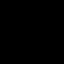
[frame 59/64]
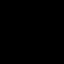

[Series 1: wbr_s-proj_st stress_(id)_sa · 6.5mm · 6.51mm/px · 6 of 64 frames shown (2 of 2)]
[frame 6/64]
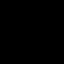
[frame 16/64]
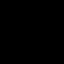
[frame 27/64]
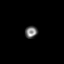
[frame 38/64]
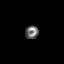
[frame 48/64]
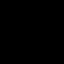
[frame 59/64]
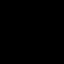

[18 of 18 positions shown; findings below may reference images not displayed]

Canned report from images found in remote index.

Refer to host system for actual result text.

## 2017-10-02 DIAGNOSIS — E1165 Type 2 diabetes mellitus with hyperglycemia: Secondary | ICD-10-CM | POA: Diagnosis not present

## 2017-10-02 DIAGNOSIS — E785 Hyperlipidemia, unspecified: Secondary | ICD-10-CM | POA: Diagnosis not present

## 2017-10-18 DIAGNOSIS — F341 Dysthymic disorder: Secondary | ICD-10-CM | POA: Diagnosis not present

## 2017-10-18 DIAGNOSIS — F4010 Social phobia, unspecified: Secondary | ICD-10-CM | POA: Diagnosis not present

## 2017-10-25 DIAGNOSIS — F341 Dysthymic disorder: Secondary | ICD-10-CM | POA: Diagnosis not present

## 2017-10-25 DIAGNOSIS — F4010 Social phobia, unspecified: Secondary | ICD-10-CM | POA: Diagnosis not present

## 2017-11-01 DIAGNOSIS — F4010 Social phobia, unspecified: Secondary | ICD-10-CM | POA: Diagnosis not present

## 2017-11-01 DIAGNOSIS — F341 Dysthymic disorder: Secondary | ICD-10-CM | POA: Diagnosis not present

## 2017-11-08 DIAGNOSIS — F4010 Social phobia, unspecified: Secondary | ICD-10-CM | POA: Diagnosis not present

## 2017-11-08 DIAGNOSIS — F341 Dysthymic disorder: Secondary | ICD-10-CM | POA: Diagnosis not present

## 2017-11-16 DIAGNOSIS — M8588 Other specified disorders of bone density and structure, other site: Secondary | ICD-10-CM | POA: Diagnosis not present

## 2017-11-22 DIAGNOSIS — F341 Dysthymic disorder: Secondary | ICD-10-CM | POA: Diagnosis not present

## 2017-11-22 DIAGNOSIS — F4010 Social phobia, unspecified: Secondary | ICD-10-CM | POA: Diagnosis not present

## 2017-12-06 DIAGNOSIS — F4010 Social phobia, unspecified: Secondary | ICD-10-CM | POA: Diagnosis not present

## 2017-12-06 DIAGNOSIS — F341 Dysthymic disorder: Secondary | ICD-10-CM | POA: Diagnosis not present

## 2017-12-18 ENCOUNTER — Telehealth: Payer: Self-pay | Admitting: Cardiology

## 2017-12-18 NOTE — Telephone Encounter (Signed)
Pt becomes dizzy with exertion--on treadmill after about 15 min and doing yardwork.  Along with dizziness both arms start to feel numb/tingly.  This is accompanied by burning in the center of her chest.  This weekend started taking zantac in case this is related to acid reflux. Drinking at least 4-6 glasses of water daily.  BPs have been 140s-150s/80s.  Friday night, when she was feeling "lousy, miserable" BP was 160/90.  No heart rates available.  Per pt they are normal.  The dizziness is not particularly new with activity but the other symptoms are.  In addition, she has felt exceptionally fatigued.  With her really strong family history of heart disease she feels she may need a heart catheterization.   Last seen by Dr. Marlou Porch 06/2016.  Scheduled pt with B. Bhagat tomorrow.  Advised patient to minimize exertion/limit activities until she is seen.  Advised that if symptoms return and she is at rest she should be evaluated urgently at ED.  Pt verbalizes understanding and appreciative for assistance.

## 2017-12-18 NOTE — Telephone Encounter (Signed)
NEW MESSAGE   STAT if patient feels like he/she is going to faint   1) Are you dizzy now? NO, only on exertion.  2) Do you feel faint or have you passed out? NO  3) Do you have any other symptoms? Fatigue, burning sensation in center of chest  4) Have you checked your HR and BP (record if available)? 150/88

## 2017-12-19 ENCOUNTER — Ambulatory Visit: Payer: BLUE CROSS/BLUE SHIELD | Admitting: Physician Assistant

## 2017-12-19 ENCOUNTER — Encounter: Payer: Self-pay | Admitting: Physician Assistant

## 2017-12-19 VITALS — BP 150/88 | HR 86 | Ht 60.0 in | Wt 187.0 lb

## 2017-12-19 DIAGNOSIS — I10 Essential (primary) hypertension: Secondary | ICD-10-CM

## 2017-12-19 DIAGNOSIS — I200 Unstable angina: Secondary | ICD-10-CM

## 2017-12-19 DIAGNOSIS — E119 Type 2 diabetes mellitus without complications: Secondary | ICD-10-CM | POA: Diagnosis not present

## 2017-12-19 DIAGNOSIS — E7800 Pure hypercholesterolemia, unspecified: Secondary | ICD-10-CM | POA: Diagnosis not present

## 2017-12-19 LAB — CBC
HEMOGLOBIN: 13.4 g/dL (ref 11.1–15.9)
Hematocrit: 39.6 % (ref 34.0–46.6)
MCH: 26.9 pg (ref 26.6–33.0)
MCHC: 33.8 g/dL (ref 31.5–35.7)
MCV: 80 fL (ref 79–97)
Platelets: 283 10*3/uL (ref 150–379)
RBC: 4.98 x10E6/uL (ref 3.77–5.28)
RDW: 17.9 % — ABNORMAL HIGH (ref 12.3–15.4)
WBC: 7.4 10*3/uL (ref 3.4–10.8)

## 2017-12-19 LAB — BASIC METABOLIC PANEL
BUN/Creatinine Ratio: 12 (ref 12–28)
BUN: 10 mg/dL (ref 8–27)
CALCIUM: 10 mg/dL (ref 8.7–10.3)
CO2: 27 mmol/L (ref 20–29)
Chloride: 103 mmol/L (ref 96–106)
Creatinine, Ser: 0.83 mg/dL (ref 0.57–1.00)
GFR calc Af Amer: 86 mL/min/{1.73_m2} (ref >59)
GFR calc non Af Amer: 75 mL/min/{1.73_m2} (ref >59)
GLUCOSE: 145 mg/dL — AB (ref 65–99)
POTASSIUM: 5.1 mmol/L (ref 3.5–5.2)
Sodium: 139 mmol/L (ref 134–144)

## 2017-12-19 MED ORDER — CARVEDILOL 3.125 MG PO TABS: 3.1250 mg | ORAL_TABLET | Freq: Two times a day (BID) | ORAL | 3 refills | Status: DC

## 2017-12-19 NOTE — H&P (View-Only) (Signed)
Cardiology Office Note    Date:  12/19/2017   ID:  Summer Hodge September 10, 1955, MRN [id number]  PCP:  Summer Austin, MD  Cardiologist:  Dr. Marlou Porch   Chief Complaint: Dizziness  History of Present Illness:   Summer Hodge is a 62 y.o. female diabetes, hyperlipidemia, hypertension here today for dizziness.   Brother died of myocardial infarction at age 64. She had another brother who died at age 63 with myocardial infarction as well. In the past Crestor caused muscle aches, Lipitor causes muscle aches and pravastatin cause muscle aches.  Seen by Dr. Marlou Porch in 2017 for chest pain and dyspnea. Follow up stress test was low risk.   Patient has chronic dyspnea on exertion since seen by Dr. Marlou Porch.  However, for the past 4weeks patient she has noted exertional substernal chest discomfort with bilateral arm pain.  She describes her chest pain as burning sensation that radiates to her bilateral arm which she describes as numbness and tingling.  This occurs while she exercise on her treadmill.  Relieved with rest.  Now occurring with minimal exertion while walking her dog.  She had a worse episode last week leading to further evaluation.  Denies orthopnea, PND, syncope, lower extremity edema or melena.  Past Medical History:  Diagnosis Date  . Allergic rhinitis   . Anemia   . Crohn disease ([place])   . Depression   . Diabetes mellitus without complication ([place])   . Family history of heart disease   . Hyperlipidemia   . Hypertension   . Osteopenia   . Sleep apnea   . Vitamin D deficiency     Past Surgical History:  Procedure Laterality Date  . CESAREAN SECTION    . CHOLECYSTECTOMY, LAPAROSCOPIC  2001  . HEMORRHOID SURGERY  04/2010    Current Medications:  Prior to Admission medications   Medication Sig Start Date End Date Taking? Authorizing Provider  aspirin 81 MG tablet Take 81 mg by mouth daily.   Yes [provider]  Coenzyme Q10 (CO Q 10) 100 MG CAPS Take 1  capsule by mouth daily.   Yes [provider]  ezetimibe (ZETIA) 10 MG tablet Take 1 tablet by mouth once a week. 10/02/17  Yes [provider]  lisinopril (PRINIVIL,ZESTRIL) 5 MG tablet Take 5 mg by mouth daily.   Yes [provider]  rosuvastatin (CRESTOR) 5 MG tablet Take 1 tablet (5 mg total) by mouth once a week. 07/07/16 10/05/16  Jerline Pain, MD   Allergies:   Crestor [rosuvastatin]; Lipitor [atorvastatin]; and Pravastatin   Social History   Socioeconomic History  . Marital status: Married    Spouse name: Not on file  . Number of children: Not on file  . Years of education: Not on file  . Highest education level: Not on file  Occupational History  . Not on file  Social Needs  . Financial resource strain: Not on file  . Food insecurity:    Worry: Not on file    Inability: Not on file  . Transportation needs:    Medical: Not on file    Non-medical: Not on file  Tobacco Use  . Smoking status: Former Smoker    Types: Cigarettes    Last attempt to quit: 06/23/1975    Years since quitting: 42.5  . Smokeless tobacco: Never Used  . Tobacco comment: SMOKED BRIEFLY IN EARLY 20'S  Substance and Sexual Activity  . Alcohol use: Yes    Comment:  1 BEER OR WINE 3-4 X A WEEK  . Drug use: No  . Sexual activity: Yes    Partners: Male    Comment: RARELY  Lifestyle  . Physical activity:    Days per week: Not on file    Minutes per session: Not on file  . Stress: Not on file  Relationships  . Social connections:    Talks on phone: Not on file    Gets together: Not on file    Attends religious service: Not on file    Active member of club or organization: Not on file    Attends meetings of clubs or organizations: Not on file    Relationship status: Not on file  Other Topics Concern  . Not on file  Social History Narrative  . Not on file     Family History:  The patient's family history includes CAD in her brother, brother, brother, brother, father,  and paternal grandfather; Cancer in her mother; Diabetes in her father and mother; Healthy in her son; Heart attack in her brother, brother, and paternal grandfather; Heart disease in her brother; Hepatitis C in her brother; Hypertension in her brother, brother, and mother; Hypotension in her father; Mental illness in her brother.   ROS:   Please see the history of present illness.    ROS All other systems reviewed and are negative.   PHYSICAL EXAM:   VS:  BP (!) 150/88   Pulse 86   Ht 5' (1.524 m)   Wt 187 lb (84.8 kg)   LMP  (LMP Unknown)   SpO2 99%   BMI 36.52 kg/m    GEN: Well nourished, well developed, in no acute distress  HEENT: normal  Neck: no JVD, carotid bruits, or masses Cardiac: RRR; no murmurs, rubs, or gallops,no edema  Respiratory:  clear to auscultation bilaterally, normal work of breathing GI: soft, nontender, nondistended, + BS MS: no deformity or atrophy  Skin: warm and dry, no rash Neuro:  Alert and Oriented x 3, Strength and sensation are intact Psych: euthymic mood, full affect  Wt Readings from Last 3 Encounters:  12/19/17 187 lb (84.8 kg)  07/20/16 188 lb (85.3 kg)  07/07/16 188 lb (85.3 kg)      Studies/Labs Reviewed:   EKG:  EKG is ordered today.  The ekg ordered today demonstrates sinus rhythm at rate of 86 bpm and T wave inversion inferior laterally  Recent Labs: No results found for requested labs within last 8760 hours.   Lipid Panel    Component Value Date/Time   CHOL 167 07/15/2008 0818   TRIG 174 (H) 07/15/2008 0818   HDL 39.2 07/15/2008 0818   CHOLHDL 4.3 CALC 07/15/2008 0818   VLDL 35 07/15/2008 0818   LDLCALC 93 07/15/2008 0818    Additional studies/ records that were reviewed today include:    Myoview 07/20/2016  Nuclear stress EF: 76%. The left ventricular ejection fraction is hyperdynamic (>65%).  There was no ST segment deviation noted during stress.  The study is normal.  This is a low risk  study.    ASSESSMENT & PLAN:    1. Accelerating angina For the past 4 to 6 weeks she is having exertional substernal chest discomfort which she describes as burning sensation.  Radiating to bilateral arm as numbness.  Limiting her exercise.  Now occurring with minimal exertion.  EKG shows new T wave inversion inferior laterally.  Her cardiac risk factors includes hypertension, diabetes, hyperlipidemia and family history of CAD. -  Given her symptoms concerning for cardiac etiology, will get cardiac catheterization.  Continue aspirin 81 mg.  Add beta-blocker. The patient understands that risks include but are not limited to stroke (1 in 1000), death (1 in 13), kidney failure [usually temporary] (1 in 500), bleeding (1 in 200), allergic reaction [possibly serious] (1 in 200), and agrees to proceed.   2.  Hyperlipidemia -Continue Zetia and Crestor once a week.  Stating intolerance.  If she  required stent placement, referred to lipid clinic for PCSK9 inhibitor.  3.  Hypertension -Elevated today.  Continue ACE.  Add beta-blocker.  4. DM - Per PCP. Not on any medications.     Medication Adjustments/Labs and Tests Ordered: Current medicines are reviewed at length with the patient today.  Concerns regarding medicines are outlined above.  Medication changes, Labs and Tests ordered today are listed in the Patient Instructions below. Patient Instructions  Medication Instructions: Your physician has recommended you make the following change in your medication:  START: Coreg 3.125 2 times daily   Labwork: TODAY: BMET & CBC STAT  Procedures/Testing: Your physician has requested that you have a cardiac catheterization. Cardiac catheterization is used to diagnose and/or treat various heart conditions. Doctors may recommend this procedure for a number of different reasons. The most common reason is to evaluate chest pain. Chest pain can be a symptom of coronary artery disease (CAD), and cardiac  catheterization can show whether plaque is narrowing or blocking your heart's arteries. This procedure is also used to evaluate the valves, as well as measure the blood flow and oxygen levels in different parts of your heart. For further information please visit [email or web address]. Please follow instruction sheet, as given.    Follow-Up: Your physician recommends that you schedule a follow-up appointment on 01/05/09 at 11:00am with [facility] PA.C   Any Additional Special Instructions Will Be Listed Below (If Applicable).    If you need a refill on your cardiac medications before your next appointment, please call your pharmacy.      [place] OFFICE [address], [place] [address] [place] [address] Dept: [phone] Loc: [place]  12/19/2017  You are scheduled for a Cardiac Catheterization on Wednesday, May 1 with Dr. Lauree Chandler.  1. Please arrive at the [facility] (Main Entrance A) at [facility]: [address] [place], [place] [address] at 2:00 PM (two hours before your procedure to ensure your preparation). Free valet parking service is available.   Special note: Every effort is made to have your procedure done on time. Please understand that emergencies sometimes delay scheduled procedures.  2. Diet: Do not eat or drink anything after midnight prior to your procedure except sips of water to take medications.  3. Labs: You will need to have blood drawn on Tuesday, April 30 at [facility] at [facility]. [address]  Open: 7:30am - 5pm    Phone: [phone]. You do not need to be fasting.  4. Medication instructions in preparation for your procedure:    Current Outpatient Medications (Cardiovascular):  .  ezetimibe (ZETIA) 10 MG tablet, Take 1 tablet by mouth once a week. Marland Kitchen  lisinopril (PRINIVIL,ZESTRIL) 5 MG tablet, Take 5  mg by mouth daily. .  carvedilol (COREG) 3.125 MG tablet, Take 1 tablet (3.125 mg total) by mouth 2 (two) times daily. .  rosuvastatin (CRESTOR) 5 MG tablet, Take 1 tablet (5  mg total) by mouth once a week.   Current Outpatient Medications (Analgesics):  .  aspirin 81 MG tablet, Take 81 mg by mouth daily.   Current Outpatient Medications (Other):  Marland Kitchen  Coenzyme Q10 (CO Q 10) 100 MG CAPS, Take 1 capsule by mouth daily. *For reference purposes while preparing patient instructions.   Delete this med list prior to printing instructions for patient.*  On the morning of your procedure, take your Aspirin and any morning medicines NOT listed above.  You may use sips of water.  5. Plan for one night stay--bring personal belongings. 6. Bring a current list of your medications and current insurance cards. 7. You MUST have a responsible person to drive you home. 8. Someone MUST be with you the first 24 hours after you arrive home or your discharge will be delayed. 9. Please wear clothes that are easy to get on and off and wear slip-on shoes.  Thank you for allowing [place] to care for you!   -- [facility] Invasive Cardiovascular services     Signed, Leanor Kail, [place]  12/19/2017 4:15 PM    [address], [place], [place]  [address] Phone: [phone]; Fax: [phone]

## 2017-12-19 NOTE — Patient Instructions (Addendum)
Medication Instructions: Your physician has recommended you make the following change in your medication:  START: Coreg 3.125 2 times daily   Labwork: TODAY: BMET & CBC STAT  Procedures/Testing: Your physician has requested that you have a cardiac catheterization. Cardiac catheterization is used to diagnose and/or treat various heart conditions. Doctors may recommend this procedure for a number of different reasons. The most common reason is to evaluate chest pain. Chest pain can be a symptom of coronary artery disease (CAD), and cardiac catheterization can show whether plaque is narrowing or blocking your heart's arteries. This procedure is also used to evaluate the valves, as well as measure the blood flow and oxygen levels in different parts of your heart. For further information please visit [email or web address]. Please follow instruction sheet, as given.    Follow-Up: Your physician recommends that you schedule a follow-up appointment on 01/05/09 at 11:00am with [facility] PA.C   Any Additional Special Instructions Will Be Listed Below (If Applicable).    If you need a refill on your cardiac medications before your next appointment, please call your pharmacy.      [place] OFFICE [address] [place] [address] [place] [address] Dept: [phone] Loc: [place]  12/19/2017  You are scheduled for a Cardiac Catheterization on Wednesday, May 1 with Dr. Lauree Chandler.  1. Please arrive at the [facility] (Main Entrance A) at [facility]: [address] [place], [place] [address] at 2:00 PM (two hours before your procedure to ensure your preparation). Free valet parking service is available.   Special note: Every effort is made to have your procedure done on time. Please understand that emergencies sometimes delay scheduled procedures.  2. Diet: Do not eat or drink  anything after midnight prior to your procedure except sips of water to take medications.  3. Labs: You will need to have blood drawn on Tuesday, April 30 at [facility] at [facility]. [address]  Open: 7:30am - 5pm    Phone: [phone]. You do not need to be fasting.  4. Medication instructions in preparation for your procedure:    Current Outpatient Medications (Cardiovascular):  .  ezetimibe (ZETIA) 10 MG tablet, Take 1 tablet by mouth once a week. Marland Kitchen  lisinopril (PRINIVIL,ZESTRIL) 5 MG tablet, Take 5 mg by mouth daily. .  carvedilol (COREG) 3.125 MG tablet, Take 1 tablet (3.125 mg total) by mouth 2 (two) times daily. .  rosuvastatin (CRESTOR) 5 MG tablet, Take 1 tablet (5 mg total) by mouth once a week.   Current Outpatient Medications (Analgesics):  .  aspirin 81 MG tablet, Take 81 mg by mouth daily.   Current Outpatient Medications (Other):  Marland Kitchen  Coenzyme Q10 (CO Q 10) 100 MG CAPS, Take 1 capsule by mouth daily. *For reference purposes while preparing patient instructions.   Delete this med list prior to printing instructions for patient.*  On the morning of your procedure, take your Aspirin and any morning medicines NOT listed above.  You may use sips of water.  5. Plan for one night stay--bring personal belongings. 6. Bring a current list of your medications and current insurance cards. 7. You MUST have a responsible person to drive you home. 8. Someone MUST be with you the first 24 hours after you arrive home or your discharge will be delayed. 9. Please wear clothes that are easy to get on and off  and wear slip-on shoes.  Thank you for allowing [place] to care for you!   --  Invasive Cardiovascular services

## 2017-12-19 NOTE — Progress Notes (Addendum)
Cardiology Office Note    Date:  12/19/2017   ID:  Justus, Duerr [birth year], MRN [id number]  PCP:  Darcus Austin, MD  Cardiologist:  Dr. Marlou Porch   Chief Complaint: Dizziness  History of Present Illness:   Summer Hodge is a 62 y.o. female diabetes, hyperlipidemia, hypertension here today for dizziness.   Brother died of myocardial infarction at age 38. She had another brother who died at age 30 with myocardial infarction as well. In the past Crestor caused muscle aches, Lipitor causes muscle aches and pravastatin cause muscle aches.  Seen by Dr. Marlou Porch in 2017 for chest pain and dyspnea. Follow up stress test was low risk.   Patient has chronic dyspnea on exertion since seen by Dr. Marlou Porch.  However, for the past 4weeks patient she has noted exertional substernal chest discomfort with bilateral arm pain.  She describes her chest pain as burning sensation that radiates to her bilateral arm which she describes as numbness and tingling.  This occurs while she exercise on her treadmill.  Relieved with rest.  Now occurring with minimal exertion while walking her dog.  She had a worse episode last week leading to further evaluation.  Denies orthopnea, PND, syncope, lower extremity edema or melena.  Past Medical History:  Diagnosis Date  . Allergic rhinitis   . Anemia   . Crohn disease ([place])   . Depression   . Diabetes mellitus without complication ([place])   . Family history of heart disease   . Hyperlipidemia   . Hypertension   . Osteopenia   . Sleep apnea   . Vitamin D deficiency     Past Surgical History:  Procedure Laterality Date  . CESAREAN SECTION    . CHOLECYSTECTOMY, LAPAROSCOPIC  2001  . HEMORRHOID SURGERY  04/2010    Current Medications:  Prior to Admission medications   Medication Sig Start Date End Date Taking? Authorizing Provider  aspirin 81 MG tablet Take 81 mg by mouth daily.   Yes [provider]  Coenzyme Q10 (CO Q 10) 100 MG CAPS Take 1  capsule by mouth daily.   Yes [provider]  ezetimibe (ZETIA) 10 MG tablet Take 1 tablet by mouth once a week. 10/02/17  Yes [provider]  lisinopril (PRINIVIL,ZESTRIL) 5 MG tablet Take 5 mg by mouth daily.   Yes [provider]  rosuvastatin (CRESTOR) 5 MG tablet Take 1 tablet (5 mg total) by mouth once a week. 07/07/16 10/05/16  Jerline Pain, MD   Allergies:   Crestor [rosuvastatin]; Lipitor [atorvastatin]; and Pravastatin   Social History   Socioeconomic History  . Marital status: Married    Spouse name: Not on file  . Number of children: Not on file  . Years of education: Not on file  . Highest education level: Not on file  Occupational History  . Not on file  Social Needs  . Financial resource strain: Not on file  . Food insecurity:    Worry: Not on file    Inability: Not on file  . Transportation needs:    Medical: Not on file    Non-medical: Not on file  Tobacco Use  . Smoking status: Former Smoker    Types: Cigarettes    Last attempt to quit: 06/23/1975    Years since quitting: 42.5  . Smokeless tobacco: Never Used  . Tobacco comment: SMOKED BRIEFLY IN EARLY 20'S  Substance and Sexual Activity  . Alcohol use: Yes    Comment:  1 BEER OR WINE 3-4 X A WEEK  . Drug use: No  . Sexual activity: Yes    Partners: Male    Comment: RARELY  Lifestyle  . Physical activity:    Days per week: Not on file    Minutes per session: Not on file  . Stress: Not on file  Relationships  . Social connections:    Talks on phone: Not on file    Gets together: Not on file    Attends religious service: Not on file    Active member of club or organization: Not on file    Attends meetings of clubs or organizations: Not on file    Relationship status: Not on file  Other Topics Concern  . Not on file  Social History Narrative  . Not on file     Family History:  The patient's family history includes CAD in her brother, brother, brother, brother, father,  and paternal grandfather; Cancer in her mother; Diabetes in her father and mother; Healthy in her son; Heart attack in her brother, brother, and paternal grandfather; Heart disease in her brother; Hepatitis C in her brother; Hypertension in her brother, brother, and mother; Hypotension in her father; Mental illness in her brother.   ROS:   Please see the history of present illness.    ROS All other systems reviewed and are negative.   PHYSICAL EXAM:   VS:  BP (!) 150/88   Pulse 86   Ht 5' (1.524 m)   Wt 187 lb (84.8 kg)   LMP  (LMP Unknown)   SpO2 99%   BMI 36.52 kg/m    GEN: Well nourished, well developed, in no acute distress  HEENT: normal  Neck: no JVD, carotid bruits, or masses Cardiac: RRR; no murmurs, rubs, or gallops,no edema  Respiratory:  clear to auscultation bilaterally, normal work of breathing GI: soft, nontender, nondistended, + BS MS: no deformity or atrophy  Skin: warm and dry, no rash Neuro:  Alert and Oriented x 3, Strength and sensation are intact Psych: euthymic mood, full affect  Wt Readings from Last 3 Encounters:  12/19/17 187 lb (84.8 kg)  07/20/16 188 lb (85.3 kg)  07/07/16 188 lb (85.3 kg)      Studies/Labs Reviewed:   EKG:  EKG is ordered today.  The ekg ordered today demonstrates sinus rhythm at rate of 86 bpm and T wave inversion inferior laterally  Recent Labs: No results found for requested labs within last 8760 hours.   Lipid Panel    Component Value Date/Time   CHOL 167 07/15/2008 0818   TRIG 174 (H) 07/15/2008 0818   HDL 39.2 07/15/2008 0818   CHOLHDL 4.3 CALC 07/15/2008 0818   VLDL 35 07/15/2008 0818   LDLCALC 93 07/15/2008 0818    Additional studies/ records that were reviewed today include:    Myoview 07/20/2016  Nuclear stress EF: 76%. The left ventricular ejection fraction is hyperdynamic (>65%).  There was no ST segment deviation noted during stress.  The study is normal.  This is a low risk  study.    ASSESSMENT & PLAN:    1. Accelerating angina For the past 4 to 6 weeks she is having exertional substernal chest discomfort which she describes as burning sensation.  Radiating to bilateral arm as numbness.  Limiting her exercise.  Now occurring with minimal exertion.  EKG shows new T wave inversion inferior laterally.  Her cardiac risk factors includes hypertension, diabetes, hyperlipidemia and family history of CAD. -  Given her symptoms concerning for cardiac etiology, will get cardiac catheterization.  Continue aspirin 81 mg.  Add beta-blocker. The patient understands that risks include but are not limited to stroke (1 in 1000), death (1 in 74), kidney failure [usually temporary] (1 in 500), bleeding (1 in 200), allergic reaction [possibly serious] (1 in 200), and agrees to proceed.   2.  Hyperlipidemia -Continue Zetia and Crestor once a week.  Stating intolerance.  If she  required stent placement, referred to lipid clinic for PCSK9 inhibitor.  3.  Hypertension -Elevated today.  Continue ACE.  Add beta-blocker.  4. DM - Per PCP. Not on any medications.     Medication Adjustments/Labs and Tests Ordered: Current medicines are reviewed at length with the patient today.  Concerns regarding medicines are outlined above.  Medication changes, Labs and Tests ordered today are listed in the Patient Instructions below. Patient Instructions  Medication Instructions: Your physician has recommended you make the following change in your medication:  START: Coreg 3.125 2 times daily   Labwork: TODAY: BMET & CBC STAT  Procedures/Testing: Your physician has requested that you have a cardiac catheterization. Cardiac catheterization is used to diagnose and/or treat various heart conditions. Doctors may recommend this procedure for a number of different reasons. The most common reason is to evaluate chest pain. Chest pain can be a symptom of coronary artery disease (CAD), and cardiac  catheterization can show whether plaque is narrowing or blocking your heart's arteries. This procedure is also used to evaluate the valves, as well as measure the blood flow and oxygen levels in different parts of your heart. For further information please visit [email or web address]. Please follow instruction sheet, as given.    Follow-Up: Your physician recommends that you schedule a follow-up appointment on 01/05/09 at 11:00am with [facility] PA.C   Any Additional Special Instructions Will Be Listed Below (If Applicable).    If you need a refill on your cardiac medications before your next appointment, please call your pharmacy.      [facility] OFF[facility] [address], [place] [address] [place] [address] Dept: [phone] Loc: [place]  12/19/2017  You are scheduled for a Cardiac Catheterization on Wednesday, May 1 with Dr. Lauree Chandler.  1. Please arrive at the [facility] (Main Entrance A) at [facility]: [address] [place], [place] [address] at 2:00 PM (two hours before your procedure to ensure your preparation). Free valet parking service is available.   Special note: Every effort is made to have your procedure done on time. Please understand that emergencies sometimes delay scheduled procedures.  2. Diet: Do not eat or drink anything after midnight prior to your procedure except sips of water to take medications.  3. Labs: You will need to have blood drawn on Tuesday, April 30 at [facility]. [address]  Open: 7:30am - 5pm    Phone: [phone]. You do not need to be fasting.  4. Medication instructions in preparation for your procedure:    Current Outpatient Medications (Cardiovascular):  .  ezetimibe (ZETIA) 10 MG tablet, Take 1 tablet by mouth once a week. Marland Kitchen  lisinopril (PRINIVIL,ZESTRIL) 5 MG tablet, Take 5  mg by mouth daily. .  carvedilol (COREG) 3.125 MG tablet, Take 1 tablet (3.125 mg total) by mouth 2 (two) times daily. .  rosuvastatin (CRESTOR) 5 MG tablet, Take 1 tablet (5  mg total) by mouth once a week.   Current Outpatient Medications (Analgesics):  .  aspirin 81 MG tablet, Take 81 mg by mouth daily.   Current Outpatient Medications (Other):  Marland Kitchen  Coenzyme Q10 (CO Q 10) 100 MG CAPS, Take 1 capsule by mouth daily. *For reference purposes while preparing patient instructions.   Delete this med list prior to printing instructions for patient.*  On the morning of your procedure, take your Aspirin and any morning medicines NOT listed above.  You may use sips of water.  5. Plan for one night stay--bring personal belongings. 6. Bring a current list of your medications and current insurance cards. 7. You MUST have a responsible person to drive you home. 8. Someone MUST be with you the first 24 hours after you arrive home or your discharge will be delayed. 9. Please wear clothes that are easy to get on and off and wear slip-on shoes.  Thank you for allowing [place] to care for you!   -- [facility] Invasive Cardiovascular services     Signed, Leanor Kail, [place]  12/19/2017 4:15 PM    [address], [place], [place]  [address] Phone: [phone]; Fax: [phone]

## 2017-12-20 ENCOUNTER — Ambulatory Visit (HOSPITAL_COMMUNITY)
Admission: RE | Disposition: A | Discharge status: Home / Self Care | Payer: Self-pay | Source: Ambulatory Visit | Attending: Cardiovascular Disease

## 2017-12-20 ENCOUNTER — Ambulatory Visit (HOSPITAL_COMMUNITY)
Admission: RE | Admit: 2017-12-20 | Discharge: 2017-12-21 | Disposition: A | Discharge status: Home / Self Care | Payer: BLUE CROSS/BLUE SHIELD | Source: Ambulatory Visit | Attending: Cardiovascular Disease | Admitting: Cardiovascular Disease

## 2017-12-20 DIAGNOSIS — E559 Vitamin D deficiency, unspecified: Secondary | ICD-10-CM | POA: Insufficient documentation

## 2017-12-20 DIAGNOSIS — I2584 Coronary atherosclerosis due to calcified coronary lesion: Secondary | ICD-10-CM | POA: Diagnosis not present

## 2017-12-20 DIAGNOSIS — Z6836 Body mass index (BMI) 36.0-36.9, adult: Secondary | ICD-10-CM | POA: Diagnosis not present

## 2017-12-20 DIAGNOSIS — I200 Unstable angina: Secondary | ICD-10-CM | POA: Diagnosis not present

## 2017-12-20 DIAGNOSIS — E785 Hyperlipidemia, unspecified: Secondary | ICD-10-CM | POA: Insufficient documentation

## 2017-12-20 DIAGNOSIS — Z955 Presence of coronary angioplasty implant and graft: Secondary | ICD-10-CM

## 2017-12-20 DIAGNOSIS — K5090 Crohn's disease, unspecified, without complications: Secondary | ICD-10-CM | POA: Diagnosis not present

## 2017-12-20 DIAGNOSIS — Z7982 Long term (current) use of aspirin: Secondary | ICD-10-CM | POA: Insufficient documentation

## 2017-12-20 DIAGNOSIS — M8580 Other specified disorders of bone density and structure, unspecified site: Secondary | ICD-10-CM | POA: Diagnosis not present

## 2017-12-20 DIAGNOSIS — G4730 Sleep apnea, unspecified: Secondary | ICD-10-CM | POA: Diagnosis not present

## 2017-12-20 DIAGNOSIS — E119 Type 2 diabetes mellitus without complications: Secondary | ICD-10-CM

## 2017-12-20 DIAGNOSIS — F329 Major depressive disorder, single episode, unspecified: Secondary | ICD-10-CM | POA: Insufficient documentation

## 2017-12-20 DIAGNOSIS — I10 Essential (primary) hypertension: Secondary | ICD-10-CM | POA: Diagnosis not present

## 2017-12-20 DIAGNOSIS — Z95820 Peripheral vascular angioplasty status with implants and grafts: Secondary | ICD-10-CM

## 2017-12-20 DIAGNOSIS — I25110 Atherosclerotic heart disease of native coronary artery with unstable angina pectoris: Secondary | ICD-10-CM | POA: Diagnosis not present

## 2017-12-20 DIAGNOSIS — I2510 Atherosclerotic heart disease of native coronary artery without angina pectoris: Secondary | ICD-10-CM

## 2017-12-20 DIAGNOSIS — Z8249 Family history of ischemic heart disease and other diseases of the circulatory system: Secondary | ICD-10-CM | POA: Diagnosis not present

## 2017-12-20 DIAGNOSIS — Z87891 Personal history of nicotine dependence: Secondary | ICD-10-CM | POA: Diagnosis not present

## 2017-12-20 HISTORY — DX: Angina pectoris, unspecified: I20.9

## 2017-12-20 HISTORY — DX: Unspecified osteoarthritis, unspecified site: M19.90

## 2017-12-20 HISTORY — PX: CORONARY STENT INTERVENTION: CATH118234

## 2017-12-20 HISTORY — PX: LEFT HEART CATH AND CORONARY ANGIOGRAPHY: CATH118249

## 2017-12-20 HISTORY — DX: Gastro-esophageal reflux disease without esophagitis: K21.9

## 2017-12-20 HISTORY — DX: Atherosclerotic heart disease of native coronary artery without angina pectoris: I25.10

## 2017-12-20 LAB — POCT ACTIVATED CLOTTING TIME
ACTIVATED CLOTTING TIME: 367 s
Activated Clotting Time: 323 seconds

## 2017-12-20 LAB — GLUCOSE, CAPILLARY
GLUCOSE-CAPILLARY: 95 mg/dL (ref 65–99)
Glucose-Capillary: 103 mg/dL — ABNORMAL HIGH (ref 65–99)
Glucose-Capillary: 112 mg/dL — ABNORMAL HIGH (ref 65–99)

## 2017-12-20 SURGERY — LEFT HEART CATH AND CORONARY ANGIOGRAPHY
Anesthesia: LOCAL

## 2017-12-20 MED ORDER — HYDRALAZINE HCL 20 MG/ML IJ SOLN
10.0000 mg | Freq: Once | INTRAMUSCULAR | Status: AC
  Administered 2017-12-20: 10 mg via INTRAVENOUS

## 2017-12-20 MED ORDER — SODIUM CHLORIDE 0.9 % WEIGHT BASED INFUSION
3.0000 mL/kg/h | INTRAVENOUS | Status: DC
  Administered 2017-12-20: 3 mL/kg/h via INTRAVENOUS

## 2017-12-20 MED ORDER — TICAGRELOR 90 MG PO TABS
ORAL_TABLET | ORAL | Status: DC | PRN
  Administered 2017-12-20: 180 mg via ORAL

## 2017-12-20 MED ORDER — FENTANYL CITRATE (PF) 100 MCG/2ML IJ SOLN
INTRAMUSCULAR | Status: AC
  Filled 2017-12-20: qty 2

## 2017-12-20 MED ORDER — HEPARIN (PORCINE) IN NACL 1000-0.9 UT/500ML-% IV SOLN
INTRAVENOUS | Status: AC
  Filled 2017-12-20: qty 1000

## 2017-12-20 MED ORDER — IOHEXOL 350 MG/ML SOLN
INTRAVENOUS | Status: DC | PRN
  Administered 2017-12-20: 200 mL

## 2017-12-20 MED ORDER — LABETALOL HCL 5 MG/ML IV SOLN
10.0000 mg | INTRAVENOUS | Status: AC | PRN
  Administered 2017-12-20: 10 mg via INTRAVENOUS
  Filled 2017-12-20: qty 4

## 2017-12-20 MED ORDER — HYDRALAZINE HCL 20 MG/ML IJ SOLN
5.0000 mg | INTRAMUSCULAR | Status: AC | PRN
  Filled 2017-12-20: qty 1

## 2017-12-20 MED ORDER — NITROGLYCERIN 1 MG/10 ML FOR IR/CATH LAB
INTRA_ARTERIAL | Status: AC
  Filled 2017-12-20: qty 10

## 2017-12-20 MED ORDER — CARVEDILOL 3.125 MG PO TABS
3.1250 mg | ORAL_TABLET | Freq: Two times a day (BID) | ORAL | Status: DC
  Administered 2017-12-20 – 2017-12-21 (×2): 3.125 mg via ORAL
  Filled 2017-12-20 (×2): qty 1

## 2017-12-20 MED ORDER — SODIUM CHLORIDE 0.9 % IV SOLN: 250.0000 mL | INTRAVENOUS | Status: DC | PRN

## 2017-12-20 MED ORDER — HEPARIN SODIUM (PORCINE) 1000 UNIT/ML IJ SOLN
INTRAMUSCULAR | Status: AC
  Filled 2017-12-20: qty 1

## 2017-12-20 MED ORDER — SODIUM CHLORIDE 0.9 % WEIGHT BASED INFUSION: 1.0000 mL/kg/h | INTRAVENOUS | Status: DC

## 2017-12-20 MED ORDER — MIDAZOLAM HCL 2 MG/2ML IJ SOLN
INTRAMUSCULAR | Status: AC
  Filled 2017-12-20: qty 2

## 2017-12-20 MED ORDER — TICAGRELOR 90 MG PO TABS
ORAL_TABLET | ORAL | Status: AC
  Filled 2017-12-20: qty 2

## 2017-12-20 MED ORDER — NITROGLYCERIN 1 MG/10 ML FOR IR/CATH LAB
INTRA_ARTERIAL | Status: DC | PRN
  Administered 2017-12-20 (×2): 200 ug via INTRACORONARY

## 2017-12-20 MED ORDER — ASPIRIN 81 MG PO CHEW: 81.0000 mg | CHEWABLE_TABLET | ORAL | Status: DC

## 2017-12-20 MED ORDER — ACETAMINOPHEN 325 MG PO TABS: 650.0000 mg | ORAL_TABLET | ORAL | Status: DC | PRN

## 2017-12-20 MED ORDER — SODIUM CHLORIDE 0.9% FLUSH: 3.0000 mL | INTRAVENOUS | Status: DC | PRN

## 2017-12-20 MED ORDER — MIDAZOLAM HCL 2 MG/2ML IJ SOLN
INTRAMUSCULAR | Status: DC | PRN
  Administered 2017-12-20: 1 mg via INTRAVENOUS
  Administered 2017-12-20: 2 mg via INTRAVENOUS

## 2017-12-20 MED ORDER — SODIUM CHLORIDE 0.9% FLUSH: 3.0000 mL | Freq: Two times a day (BID) | INTRAVENOUS | Status: DC

## 2017-12-20 MED ORDER — HEPARIN (PORCINE) IN NACL 2-0.9 UNITS/ML
INTRAMUSCULAR | Status: AC | PRN
  Administered 2017-12-20 (×2): 500 mL

## 2017-12-20 MED ORDER — ASPIRIN EC 81 MG PO TBEC: 81.0000 mg | DELAYED_RELEASE_TABLET | Freq: Every day | ORAL | Status: DC

## 2017-12-20 MED ORDER — SODIUM CHLORIDE 0.9 % IV SOLN: INTRAVENOUS | Status: AC

## 2017-12-20 MED ORDER — LIDOCAINE HCL (PF) 1 % IJ SOLN
INTRAMUSCULAR | Status: AC
  Filled 2017-12-20: qty 30

## 2017-12-20 MED ORDER — TICAGRELOR 90 MG PO TABS
90.0000 mg | ORAL_TABLET | Freq: Two times a day (BID) | ORAL | Status: DC
  Administered 2017-12-21 (×2): 90 mg via ORAL
  Filled 2017-12-20 (×2): qty 1

## 2017-12-20 MED ORDER — HEPARIN SODIUM (PORCINE) 1000 UNIT/ML IJ SOLN
INTRAMUSCULAR | Status: DC | PRN
  Administered 2017-12-20: 4500 [IU] via INTRAVENOUS
  Administered 2017-12-20: 5500 [IU] via INTRAVENOUS

## 2017-12-20 MED ORDER — FENTANYL CITRATE (PF) 100 MCG/2ML IJ SOLN
INTRAMUSCULAR | Status: DC | PRN
  Administered 2017-12-20: 25 ug via INTRAVENOUS
  Administered 2017-12-20 (×2): 50 ug via INTRAVENOUS

## 2017-12-20 MED ORDER — VERAPAMIL HCL 2.5 MG/ML IV SOLN
INTRAVENOUS | Status: AC
  Filled 2017-12-20: qty 2

## 2017-12-20 MED ORDER — ONDANSETRON HCL 4 MG/2ML IJ SOLN: 4.0000 mg | Freq: Four times a day (QID) | INTRAMUSCULAR | Status: DC | PRN

## 2017-12-20 MED ORDER — EZETIMIBE 10 MG PO TABS
10.0000 mg | ORAL_TABLET | ORAL | Status: DC
  Administered 2017-12-21: 09:00:00 10 mg via ORAL
  Filled 2017-12-20: qty 1

## 2017-12-20 MED ORDER — VERAPAMIL HCL 2.5 MG/ML IV SOLN
INTRAVENOUS | Status: DC | PRN
  Administered 2017-12-20: 10 mL via INTRA_ARTERIAL

## 2017-12-20 MED ORDER — LIDOCAINE HCL (PF) 1 % IJ SOLN
INTRAMUSCULAR | Status: DC | PRN
  Administered 2017-12-20: 2 mL

## 2017-12-20 MED ORDER — ROSUVASTATIN CALCIUM 10 MG PO TABS
10.0000 mg | ORAL_TABLET | Freq: Every day | ORAL | Status: DC
  Administered 2017-12-20 – 2017-12-21 (×2): 10 mg via ORAL
  Filled 2017-12-20 (×2): qty 1

## 2017-12-20 MED ORDER — LISINOPRIL 10 MG PO TABS
10.0000 mg | ORAL_TABLET | Freq: Every day | ORAL | Status: DC
  Administered 2017-12-21: 10 mg via ORAL
  Filled 2017-12-20: qty 1

## 2017-12-20 MED ORDER — SODIUM CHLORIDE 0.9% FLUSH
3.0000 mL | Freq: Two times a day (BID) | INTRAVENOUS | Status: DC
  Administered 2017-12-20 – 2017-12-21 (×3): 3 mL via INTRAVENOUS

## 2017-12-20 MED ORDER — ANGIOPLASTY BOOK
Freq: Once | Status: AC
  Administered 2017-12-20: 1
  Filled 2017-12-20: qty 1

## 2017-12-20 SURGICAL SUPPLY — 28 items
BALLN EMERGE MR 2.0X8 (BALLOONS) ×2
BALLN SAPPHIRE 2.5X12 (BALLOONS) ×2
BALLN SAPPHIRE ~~LOC~~ 2.5X8 (BALLOONS) ×1 IMPLANT
BALLN SAPPHIRE ~~LOC~~ 3.75X12 (BALLOONS) ×1 IMPLANT
BALLN ~~LOC~~ EUPHORA RX 3.75X8 (BALLOONS) ×2
BALLOON EMERGE MR 2.0X8 (BALLOONS) IMPLANT
BALLOON SAPPHIRE 2.5X12 (BALLOONS) IMPLANT
BALLOON ~~LOC~~ EUPHORA RX 3.75X8 (BALLOONS) IMPLANT
CATH INFINITI 5 FR JL3.5 (CATHETERS) ×1 IMPLANT
CATH INFINITI 5FR ANG PIGTAIL (CATHETERS) ×1 IMPLANT
CATH INFINITI JR4 5F (CATHETERS) ×1 IMPLANT
CATH LAUNCHER 6FR JR4 (CATHETERS) ×1 IMPLANT
CATH VISTA GUIDE 6FR XBLAD3.5 (CATHETERS) ×1 IMPLANT
DEVICE RAD COMP TR BAND LRG (VASCULAR PRODUCTS) ×1 IMPLANT
GLIDESHEATH SLEND SS 6F .021 (SHEATH) ×1 IMPLANT
GUIDELINER 6F (CATHETERS) ×1 IMPLANT
GUIDEWIRE INQWIRE 1.5J.035X260 (WIRE) IMPLANT
INQWIRE 1.5J .035X260CM (WIRE) ×2
KIT ENCORE 26 ADVANTAGE (KITS) ×1 IMPLANT
KIT HEART LEFT (KITS) ×2 IMPLANT
KIT HEMO VALVE WATCHDOG (MISCELLANEOUS) ×1 IMPLANT
PACK CARDIAC CATHETERIZATION (CUSTOM PROCEDURE TRAY) ×2 IMPLANT
STENT SYNERGY DES 2.5X12 (Permanent Stent) ×1 IMPLANT
STENT SYNERGY DES 3.5X16 (Permanent Stent) ×1 IMPLANT
SYR MEDRAD MARK V 150ML (SYRINGE) ×2 IMPLANT
TRANSDUCER W/STOPCOCK (MISCELLANEOUS) ×2 IMPLANT
TUBING CIL FLEX 10 FLL-RA (TUBING) ×2 IMPLANT
WIRE COUGAR XT STRL 190CM (WIRE) ×1 IMPLANT

## 2017-12-20 NOTE — Interval H&P Note (Signed)
History and Physical Interval Note:  12/20/2017 2:43 PM  Summer Hodge  has presented today for cardiac cath with the diagnosis of unstable angina. The various methods of treatment have been discussed with the patient and family. After consideration of risks, benefits and other options for treatment, the patient has consented to  Procedure(s): LEFT HEART CATH AND CORONARY ANGIOGRAPHY (N/A) as a surgical intervention .  The patient's history has been reviewed, patient examined, no change in status, stable for surgery.  I have reviewed the patient's chart and labs.  Questions were answered to the patient's satisfaction.    Cath Lab Visit (complete for each Cath Lab visit)  Clinical Evaluation Leading to the Procedure:   ACS: No.  Non-ACS:    Anginal Classification: CCS III  Anti-ischemic medical therapy: Minimal Therapy (1 class of medications)  Non-Invasive Test Results: No non-invasive testing performed  Prior CABG: No previous CABG         Lauree Chandler

## 2017-12-21 ENCOUNTER — Encounter (HOSPITAL_COMMUNITY): Payer: Self-pay | Admitting: Cardiovascular Disease

## 2017-12-21 ENCOUNTER — Ambulatory Visit: Payer: BLUE CROSS/BLUE SHIELD | Admitting: Cardiology

## 2017-12-21 ENCOUNTER — Other Ambulatory Visit: Payer: Self-pay

## 2017-12-21 DIAGNOSIS — E559 Vitamin D deficiency, unspecified: Secondary | ICD-10-CM | POA: Diagnosis not present

## 2017-12-21 DIAGNOSIS — I200 Unstable angina: Secondary | ICD-10-CM

## 2017-12-21 DIAGNOSIS — M8580 Other specified disorders of bone density and structure, unspecified site: Secondary | ICD-10-CM | POA: Diagnosis not present

## 2017-12-21 DIAGNOSIS — K5090 Crohn's disease, unspecified, without complications: Secondary | ICD-10-CM | POA: Diagnosis not present

## 2017-12-21 DIAGNOSIS — Z6836 Body mass index (BMI) 36.0-36.9, adult: Secondary | ICD-10-CM | POA: Diagnosis not present

## 2017-12-21 DIAGNOSIS — G4730 Sleep apnea, unspecified: Secondary | ICD-10-CM | POA: Diagnosis not present

## 2017-12-21 DIAGNOSIS — Z87891 Personal history of nicotine dependence: Secondary | ICD-10-CM | POA: Diagnosis not present

## 2017-12-21 DIAGNOSIS — E119 Type 2 diabetes mellitus without complications: Secondary | ICD-10-CM | POA: Diagnosis not present

## 2017-12-21 DIAGNOSIS — Z95820 Peripheral vascular angioplasty status with implants and grafts: Secondary | ICD-10-CM

## 2017-12-21 DIAGNOSIS — Z8249 Family history of ischemic heart disease and other diseases of the circulatory system: Secondary | ICD-10-CM | POA: Diagnosis not present

## 2017-12-21 DIAGNOSIS — F329 Major depressive disorder, single episode, unspecified: Secondary | ICD-10-CM | POA: Diagnosis not present

## 2017-12-21 DIAGNOSIS — E785 Hyperlipidemia, unspecified: Secondary | ICD-10-CM | POA: Diagnosis not present

## 2017-12-21 DIAGNOSIS — I10 Essential (primary) hypertension: Secondary | ICD-10-CM

## 2017-12-21 DIAGNOSIS — I2584 Coronary atherosclerosis due to calcified coronary lesion: Secondary | ICD-10-CM | POA: Diagnosis not present

## 2017-12-21 DIAGNOSIS — I2510 Atherosclerotic heart disease of native coronary artery without angina pectoris: Secondary | ICD-10-CM | POA: Diagnosis not present

## 2017-12-21 DIAGNOSIS — I25110 Atherosclerotic heart disease of native coronary artery with unstable angina pectoris: Secondary | ICD-10-CM | POA: Diagnosis not present

## 2017-12-21 DIAGNOSIS — Z7982 Long term (current) use of aspirin: Secondary | ICD-10-CM | POA: Diagnosis not present

## 2017-12-21 LAB — CBC
HCT: 41 % (ref 36.0–46.0)
Hemoglobin: 13.2 g/dL (ref 12.0–15.0)
MCH: 26.8 pg (ref 26.0–34.0)
MCHC: 32.2 g/dL (ref 30.0–36.0)
MCV: 83.2 fL (ref 78.0–100.0)
Platelets: 282 10*3/uL (ref 150–400)
RBC: 4.93 MIL/uL (ref 3.87–5.11)
RDW: 16.1 % — AB (ref 11.5–15.5)
WBC: 9.8 10*3/uL (ref 4.0–10.5)

## 2017-12-21 LAB — BASIC METABOLIC PANEL
Anion gap: 8 (ref 5–15)
BUN: 12 mg/dL (ref 6–20)
CALCIUM: 9.3 mg/dL (ref 8.9–10.3)
CO2: 25 mmol/L (ref 22–32)
Chloride: 107 mmol/L (ref 101–111)
Creatinine, Ser: 0.81 mg/dL (ref 0.44–1.00)
GFR calc Af Amer: >60 mL/min (ref >60)
GFR calc non Af Amer: >60 mL/min (ref >60)
GLUCOSE: 121 mg/dL — AB (ref 65–99)
Potassium: 4.1 mmol/L (ref 3.5–5.1)
Sodium: 140 mmol/L (ref 135–145)

## 2017-12-21 LAB — GLUCOSE, CAPILLARY: GLUCOSE-CAPILLARY: 119 mg/dL — AB (ref 65–99)

## 2017-12-21 MED ORDER — TICAGRELOR 90 MG PO TABS: 90.0000 mg | ORAL_TABLET | Freq: Two times a day (BID) | ORAL | 0 refills | Status: DC

## 2017-12-21 MED ORDER — TICAGRELOR 90 MG PO TABS: 90.0000 mg | ORAL_TABLET | Freq: Two times a day (BID) | ORAL | 11 refills | Status: DC

## 2017-12-21 MED ORDER — ROSUVASTATIN CALCIUM 10 MG PO TABS: 10.0000 mg | ORAL_TABLET | Freq: Every day | ORAL | 11 refills | Status: DC

## 2017-12-21 MED FILL — Heparin Sod (Porcine)-NaCl IV Soln 1000 Unit/500ML-0.9%: INTRAVENOUS | Qty: 1000 | Status: AC

## 2017-12-21 NOTE — Progress Notes (Signed)
2. S/W TAMISHA @ [facility] # [phone]    BRILINTA 90 MG BID  COVER- YES  CO-PAY- $ 50.00  TIER- 4 DRUG  PRIOR APPROVAL- NO   PREFERRED PHARMACY : YES- [facility]

## 2017-12-21 NOTE — Progress Notes (Signed)
CARDIAC REHAB PHASE I   PRE:  Rate/Rhythm: 77 SR  BP:  Supine:   Sitting: 164/80  Standing:    SaO2: 97%RA  MODE:  Ambulation: 800 ft   POST:  Rate/Rhythm: 97 SR  BP:  Supine:   Sitting: 189/85,172/87  Standing:    SaO2: 98%RA 0820-0908 Pt walked 800 ft with steady gait and no CP. Slightly SOB. Tolerated well. Stressed importance of brilinta with stent. Needs to see case manager for card. Reviewed NTG use, gave heart healthy and diabetic diets, ex ed and discussed CRP 2. Referred to [place] CRP 2.   Graylon Good, RN BSN  12/21/2017 9:01 AM

## 2017-12-21 NOTE — Discharge Summary (Signed)
Discharge Summary    Patient ID: Summer Hodge,  MRN: [id number], DOB/AGE: May 29, 1956 62 y.o.  Admit date: 12/20/2017 Discharge date: 12/21/2017  Primary Care Provider: Darcus Austin Primary Cardiologist: Candee Furbish, MD  Discharge Diagnoses    Active Problems:   Unstable angina [facility])   S/P angioplasty with stent   CAD (coronary artery disease)   Essential (primary) hypertension   Allergies Allergies  Allergen Reactions  . Crestor [Rosuvastatin] Other (See Comments)    MUSCLE ACHES  . Lipitor [Atorvastatin] Other (See Comments)    MUSCLE ACHES  . Pravastatin Other (See Comments)    MUSCLE ACHES    Diagnostic Studies/Procedures    CORONARY STENT INTERVENTION  LEFT HEART CATH AND CORONARY ANGIOGRAPHY 12/20/2017     Prox RCA to Mid RCA lesion is 99% stenosed.  Mid RCA lesion is 30% stenosed.  Ost RPDA to RPDA lesion is 40% stenosed.  Post Atrio lesion is 30% stenosed.  A drug-eluting stent was successfully placed using a STENT SYNERGY DES 3.5X16.  Post intervention, there is a 0% residual stenosis.  Ost 1st Mrg to 1st Mrg lesion is 60% stenosed.  Ost 3rd Mrg to 3rd Mrg lesion is 60% stenosed.  Ost Cx lesion is 50% stenosed.  Ost LAD to Prox LAD lesion is 40% stenosed.  Ost 1st Diag lesion is 50% stenosed.  Mid LAD to Dist LAD lesion is 30% stenosed.  Prox LAD to Mid LAD lesion is 80% stenosed.  A drug-eluting stent was successfully placed using a STENT SYNERGY DES 2.5X12.  Post intervention, there is a 0% residual stenosis.  The left ventricular systolic function is normal.  LV end diastolic pressure is normal.  The left ventricular ejection fraction is greater than 65% by visual estimate.  There is no mitral valve regurgitation.   1. Severe stenosis mid RCA 2. Successful PTCA/DES x 1 mid RCA 3. Severe stenosis mid LAD.  4. Successful PTCA/DES x 1 mid LAD 5. Moderate non-obstructive disease in the distal RCA branches, ostial Circumflex,  distal Circumflex, proximal LAD 6. Normal LV systolic function  Recommendations: Continue DAPT with ASA and Brilinta for one year. Continue low dose statin. Given statin intolerance, consider Praluent or Repatha. Continue beta blocker.     Diagnostic Diagram       Post-Intervention Diagram         _____________   History of Present Illness     Summer Hodge is a 62 y.o. female diabetes, hyperlipidemia, hypertension here today for dizziness.   Brother died of myocardial infarction at age 14. She had another brother who died at age 40 with myocardial infarction as well. In the past Crestor caused muscle aches, Lipitor causes muscle aches and pravastatin cause muscle aches.  Seen by Dr. Marlou Porch in 2017 for chest pain and dyspnea. Follow up stress test was low risk.   At office visit on 12/19/17 with Robbie Lis, PA the patient complained of chronic dyspnea on exertion since seen by Dr. Marlou Porch.  However, for the past 4 weeks patient has noted exertional substernal chest discomfort with bilateral arm pain.  She describes her chest pain as burning sensation that radiates to her bilateral arm which she describes as numbness and tingling.  This occurs while she exercise on her treadmill.  Relieved with rest.  Now occurring with minimal exertion while walking her dog.  She had a worse episode last week leading to further evaluation.  Denies orthopnea, PND, syncope, lower extremity edema or melena.  Her symptoms were concerning for cardiac etiology and she was arranged to have cardiac catheterization.   Hospital Course     Consultants: none  The patient underwent cardiac catheterization on 12/20/17 that revealed severe stenosis of the mid RCA and mid LAD. She underwent successful  PTCA and DES to both sites. She has moderate non-obstructive disease in the distal RCA branches, ostial Circumflex, distal Circumflex, and proximal LAD.  Her LV function was normal. She will be continued on DAPT  for at least a year. Will attempt to work through dyspnea related to [employer]. She will need aggressive secondary prevention. Continue ACE-I, recently added BB.  She will need diabetes management as well as blood pressure control. Her BP is mildly elevated. Will continue to monitor. May need increase in her meds.   BMI is >35 with 2 or more co morbidities. She should attempt wt loss.   She has been intolerant to statins and was taking zetia and crestor once a week. Will need referral to lipid clinic for possible PCSK9 inhibitor therapy at follow up.   On day of discharge Scr 0.81. Hgb 13.2. Wt 185 lbs.   Patient has been seen by Dr. Marlou Porch today and deemed ready for discharge home. All follow up appointments have been scheduled. Discharge medications are listed below.  _____________  Discharge Vitals Blood pressure (!) 172/87, pulse 79, temperature 98.6 F (37 C), temperature source Oral, resp. rate 15, height 5' (1.524 m), weight 185 lb 3 oz (84 kg), SpO2 98 %.  Filed Weights   12/20/17 1419 12/21/17 0126  Weight: 187 lb (84.8 kg) 185 lb 3 oz (84 kg)    Labs & Radiologic Studies    CBC Recent Labs    12/19/17 1558 12/21/17 0228  WBC 7.4 9.8  HGB 13.4 13.2  HCT 39.6 41.0  MCV 80 83.2  PLT 283 836   Basic Metabolic Panel Recent Labs    12/19/17 1558 12/21/17 0228  NA 139 140  K 5.1 4.1  CL 103 107  CO2 27 25  GLUCOSE 145* 121*  BUN 10 12  CREATININE 0.83 0.81  CALCIUM 10.0 9.3   Liver Function Tests No results for input(s): AST, ALT, ALKPHOS, BILITOT, PROT, ALBUMIN in the last 72 hours. No results for input(s): LIPASE, AMYLASE in the last 72 hours. Cardiac Enzymes No results for input(s): CKTOTAL, CKMB, CKMBINDEX, TROPONINI in the last 72 hours. BNP Invalid input(s): POCBNP D-Dimer No results for input(s): DDIMER in the last 72 hours. Hemoglobin A1C No results for input(s): HGBA1C in the last 72 hours. Fasting Lipid Panel No results for input(s): CHOL, HDL,  LDLCALC, TRIG, CHOLHDL, LDLDIRECT in the last 72 hours. Thyroid Function Tests No results for input(s): TSH, T4TOTAL, T3FREE, THYROIDAB in the last 72 hours.  Invalid input(s): FREET3 _____________  No results found. Disposition   Pt is being discharged home today in good condition.  Follow-up Plans & Appointments    Follow-up Information    [facility], [place], [place] Follow up.   Specialty:  Cardiology Why:  Cardiology hospital follow up on May 17th at 11:00. Please arrive 15 minutes early for check in.  Contact information: [place] [address] [phone]          Discharge Instructions    Amb Referral to Cardiac Rehabilitation   Complete by:  As directed    Diagnosis:  Coronary Stents   Diet - low sodium heart healthy   Complete by:  As directed  Discharge instructions   Complete by:  As directed    PLEASE REMEMBER TO BRING ALL OF YOUR MEDICATIONS TO EACH OF YOUR FOLLOW-UP OFFICE VISITS.  PLEASE ATTEND ALL SCHEDULED FOLLOW-UP APPOINTMENTS.   Activity: Increase activity slowly as tolerated. You may shower, but no soaking baths (or swimming) for 1 week. No driving for 24 hours. No lifting over 5 lbs for 1 week. No sexual activity for 1 week.   You May Return to Work: in 1 week (if applicable)  Wound Care: You may wash cath site gently with soap and water. Keep cath site clean and dry. If you notice pain, swelling, bleeding or pus at your cath site, please call [phone].   Increase activity slowly   Complete by:  As directed       Discharge Medications   Allergies as of 12/21/2017      Reactions   Crestor [rosuvastatin] Other (See Comments)   MUSCLE ACHES   Lipitor [atorvastatin] Other (See Comments)   MUSCLE ACHES   Pravastatin Other (See Comments)   MUSCLE ACHES      Medication List    TAKE these medications   aspirin EC 81 MG tablet Take 81 mg by mouth at bedtime.   carvedilol 3.125 MG tablet Commonly known as:   COREG Take 1 tablet (3.125 mg total) by mouth 2 (two) times daily.   ezetimibe 10 MG tablet Commonly known as:  ZETIA Take 10 mg by mouth every Thursday.   lisinopril 10 MG tablet Commonly known as:  PRINIVIL,ZESTRIL Take 10 mg by mouth daily.   rosuvastatin 10 MG tablet Commonly known as:  CRESTOR Take 1 tablet (10 mg total) by mouth daily. Start taking on:  12/22/2017 What changed:    medication strength  how much to take  when to take this   SALINE SENSITIVE EYES Soln Place 1-2 drops into both eyes daily.   ticagrelor 90 MG Tabs tablet Commonly known as:  BRILINTA Take 1 tablet (90 mg total) by mouth 2 (two) times daily.       Aspirin prescribed at discharge?  Yes High Intensity Statin Prescribed? (Lipitor 40-80mg  or Crestor 20-40mg ): Medication based on tolerance.  Beta Blocker Prescribed? Yes For EF <40%, was ACEI/ARB Prescribed? Yes ADP Receptor Inhibitor Prescribed? (i.e. Plavix etc.-Includes Medically Managed Patients): Yes For EF <40%, Aldosterone Inhibitor Prescribed? No: normal EF Was EF assessed during THIS hospitalization? Yes Was Cardiac Rehab II ordered? (Included Medically managed Patients): Yes   Outstanding Labs/Studies   None  Duration of Discharge Encounter   Greater than 30 minutes including physician time.  Signed, Luna Fuse, NP 12/21/2017, 10:53 AM

## 2017-12-21 NOTE — Care Management Note (Signed)
Case Management Note  Patient Details  Name: Summer Hodge MRN: [id number] Date of Birth: 1955/12/07  Subjective/Objective:  From home, s/p stent intervention, on brilinta, RN Celesia gave patient the brilinta card.  NCM informed her about the co pay of 50.00 but she can use the card and if she has questions to call phone number on back of card.                   Action/Plan: DC home when ready.  Expected Discharge Date:  12/21/17               Expected Discharge Plan:  Home/Self Care  In-House Referral:     Discharge planning Services  CM Consult, Medication Assistance  Post Acute Care Choice:    Choice offered to:     DME Arranged:    DME Agency:     HH Arranged:    HH Agency:     Status of Service:  Completed, signed off  If discussed at [employer] of Stay Meetings, dates discussed:    Additional Comments:  Zenon Mayo, RN 12/21/2017, 11:35 AM

## 2017-12-21 NOTE — Progress Notes (Signed)
Progress Note  Patient Name: Summer Hodge Date of Encounter: 12/21/2017  Primary Cardiologist: No primary care provider on file.   Subjective   Feels well. No Cp no SOB  Inpatient Medications    Scheduled Meds: . aspirin EC  81 mg Oral QHS  . carvedilol  3.125 mg Oral BID  . ezetimibe  10 mg Oral Q Thu  . lisinopril  10 mg Oral Daily  . rosuvastatin  10 mg Oral Daily  . sodium chloride flush  3 mL Intravenous Q12H  . ticagrelor  90 mg Oral BID   Continuous Infusions: . sodium chloride     PRN Meds: sodium chloride, acetaminophen, ondansetron (ZOFRAN) IV, sodium chloride flush   Vital Signs    Vitals:   12/21/17 0100 12/21/17 0126 12/21/17 0806 12/21/17 0836  BP: (!) 167/69 (!) 167/76 (!) 149/80 (!) 172/87  Pulse: 73 79 79   Resp: 16 16 (!) 28 15  Temp:  98.7 F (37.1 C) 98.6 F (37 C)   TempSrc:  Oral Oral   SpO2: 100% 100% 98%   Weight:  185 lb 3 oz (84 kg)    Height:        Intake/Output Summary (Last 24 hours) at 12/21/2017 0958 Last data filed at 12/21/2017 0700 Gross per 24 hour  Intake 1522.5 ml  Output 2550 ml  Net -1027.5 ml   Filed Weights   12/20/17 1419 12/21/17 0126  Weight: 187 lb (84.8 kg) 185 lb 3 oz (84 kg)    Telemetry    NSR - Personally Reviewed  ECG    NSR - Personally Reviewed  Physical Exam   GEN: No acute distress.   Neck: No JVD Cardiac: RRR, no murmurs, rubs, or gallops.  Respiratory: Clear to auscultation bilaterally. GI: Soft, nontender, non-distended  MS: No edema; No deformity. Cath site normal Neuro:  Nonfocal  Psych: Normal affect   Labs    Chemistry Recent Labs  Lab 12/19/17 1558 12/21/17 0228  NA 139 140  K 5.1 4.1  CL 103 107  CO2 27 25  GLUCOSE 145* 121*  BUN 10 12  CREATININE 0.83 0.81  CALCIUM 10.0 9.3  GFRNONAA 75 >60  GFRAA 86 >60  ANIONGAP  --  8     Hematology Recent Labs  Lab 12/19/17 1558 12/21/17 0228  WBC 7.4 9.8  RBC 4.98 4.93  HGB 13.4 13.2  HCT 39.6 41.0  MCV 80 83.2   MCH 26.9 26.8  MCHC 33.8 32.2  RDW 17.9* 16.1*  PLT 283 282    Cardiac EnzymesNo results for input(s): TROPONINI in the last 168 hours. No results for input(s): TROPIPOC in the last 168 hours.   BNPNo results for input(s): BNP, PROBNP in the last 168 hours.   DDimer No results for input(s): DDIMER in the last 168 hours.   Radiology    No results found.  Cardiac Studies   Cath 1. Severe stenosis mid RCA 2. Successful PTCA/DES x 1 mid RCA 3. Severe stenosis mid LAD.  4. Successful PTCA/DES x 1 mid LAD 5. Moderate non-obstructive disease in the distal RCA branches, ostial Circumflex, distal Circumflex, proximal LAD 6. Normal LV systolic function  Recommendations: Continue DAPT with ASA and Brilinta for one year. Continue low dose statin. Given statin intolerance, consider Praluent or Repatha. Continue beta blocker.   Patient Profile     62 y.o. female with CAD post LAD and RCA stent, normal EF, DM with HTN, HL statin intolerant  Assessment &  Plan    CAD  - post PCI as above  - DAPT one year. Try to work through [employer] dyspnea.   - aggressive secondary prevention  DM with HTN/HL  - statin intol. Would also consider PCSK9 inhib,.   - BP mildly elevated. Monitor. May need increase in meds. Continue with weight loss  Morbid obesity  - BMI >35 with 2 or more co morbidities.   - continue to encourage weight loss  Ok for DC. Follow up in 2 -4 weeks. APP    For questions or updates, please contact [facility] Please consult [email or web address] for contact info under Cardiology/STEMI.      Signed, Candee Furbish, MD  12/21/2017, 9:58 AM

## 2017-12-25 ENCOUNTER — Telehealth (HOSPITAL_COMMUNITY): Payer: Self-pay

## 2017-12-25 NOTE — Telephone Encounter (Signed)
Called and spoke with patient to see if she is interested in the Cardiac Rehab Program. Patient stated she is interested in the program. Explained scheduling process and patient verbalized understanding. Went over insurance with patient and patient stated she understands. Will contact patient for scheduling upon review by the RN Navigator.

## 2017-12-25 NOTE — Telephone Encounter (Signed)
Patients insurance is active and benefits verified through [facility] - No co-pay, deductible amount of $2,000/$2,000 has been met, out of pocket amount of $4,000/$2,410.44 has been met, 20% co-insurance, and no pre-authorization is required. Passport/reference [phone]  Will contact patient to see if she is interested in the Cardiac Rehab Program. If interested, patient will be contacted for scheduling upon review by the RN Navigator.

## 2017-12-27 DIAGNOSIS — E1165 Type 2 diabetes mellitus with hyperglycemia: Secondary | ICD-10-CM | POA: Diagnosis not present

## 2018-01-01 ENCOUNTER — Ambulatory Visit: Payer: BLUE CROSS/BLUE SHIELD | Admitting: Cardiology

## 2018-01-02 ENCOUNTER — Telehealth (HOSPITAL_COMMUNITY): Payer: Self-pay

## 2018-01-02 NOTE — Telephone Encounter (Signed)
Called patient to schedule Cardiac Rehab - Orientation is on 03/06/18 at 8:00am. Patient will attend the 6:45am exc class. Mailed packet.

## 2018-01-04 DIAGNOSIS — F341 Dysthymic disorder: Secondary | ICD-10-CM | POA: Diagnosis not present

## 2018-01-04 DIAGNOSIS — F4010 Social phobia, unspecified: Secondary | ICD-10-CM | POA: Diagnosis not present

## 2018-01-05 ENCOUNTER — Ambulatory Visit: Payer: BLUE CROSS/BLUE SHIELD | Admitting: Physician Assistant

## 2018-01-05 ENCOUNTER — Encounter: Payer: Self-pay | Admitting: Physician Assistant

## 2018-01-05 VITALS — BP 116/70 | HR 63 | Ht 60.0 in | Wt 182.0 lb

## 2018-01-05 DIAGNOSIS — I2510 Atherosclerotic heart disease of native coronary artery without angina pectoris: Secondary | ICD-10-CM

## 2018-01-05 DIAGNOSIS — I10 Essential (primary) hypertension: Secondary | ICD-10-CM

## 2018-01-05 DIAGNOSIS — E7800 Pure hypercholesterolemia, unspecified: Secondary | ICD-10-CM | POA: Diagnosis not present

## 2018-01-05 DIAGNOSIS — E119 Type 2 diabetes mellitus without complications: Secondary | ICD-10-CM

## 2018-01-05 MED ORDER — NITROGLYCERIN 0.4 MG SL SUBL: 0.4000 mg | SUBLINGUAL_TABLET | SUBLINGUAL | 4 refills | Status: DC | PRN

## 2018-01-05 NOTE — Patient Instructions (Addendum)
Your physician recommends that you continue on your current medications as directed. Please refer to the Current Medication list given to you today. Your physician recommends that you schedule a follow-up appointment in: 3-4 MONTH WITH DR Marlou Porch AND [facility]           Nitroglycerin sublingual tablets What is this medicine? NITROGLYCERIN (nye troe GLI ser in) is a type of vasodilator. It relaxes blood vessels, increasing the blood and oxygen supply to your heart. This medicine is used to relieve chest pain caused by angina. It is also used to prevent chest pain before activities like climbing stairs, going outdoors in cold weather, or sexual activity. This medicine may be used for other purposes; ask your health care provider or pharmacist if you have questions. COMMON BRAND NAME(S): Nitroquick, Nitrostat, Nitrotab What should I tell my health care provider before I take this medicine? They need to know if you have any of these conditions: -anemia -head injury, recent stroke, or bleeding in the brain -liver disease -previous heart attack -an unusual or allergic reaction to nitroglycerin, other medicines, foods, dyes, or preservatives -pregnant or trying to get pregnant -breast-feeding How should I use this medicine? Take this medicine by mouth as needed. At the first sign of an angina attack (chest pain or tightness) place one tablet under your tongue. You can also take this medicine 5 to 10 minutes before an event likely to produce chest pain. Follow the directions on the prescription label. Let the tablet dissolve under the tongue. Do not swallow whole. Replace the dose if you accidentally swallow it. It will help if your mouth is not dry. Saliva around the tablet will help it to dissolve more quickly. Do not eat or drink, smoke or chew tobacco while a tablet is dissolving. If you are not better within 5 minutes after taking ONE dose of nitroglycerin, call 9-1-1  immediately to seek emergency medical care. Do not take more than 3 nitroglycerin tablets over 15 minutes. If you take this medicine often to relieve symptoms of angina, your doctor or health care professional may provide you with different instructions to manage your symptoms. If symptoms do not go away after following these instructions, it is important to call 9-1-1 immediately. Do not take more than 3 nitroglycerin tablets over 15 minutes. Talk to your pediatrician regarding the use of this medicine in children. Special care may be needed. Overdosage: If you think you have taken too much of this medicine contact a poison control center or emergency room at once. NOTE: This medicine is only for you. Do not share this medicine with others. What if I miss a dose? This does not apply. This medicine is only used as needed. What may interact with this medicine? Do not take this medicine with any of the following medications: -certain migraine medicines like ergotamine and dihydroergotamine (DHE) -medicines used to treat erectile dysfunction like sildenafil, tadalafil, and vardenafil -riociguat This medicine may also interact with the following medications: -alteplase -aspirin -heparin -medicines for high blood pressure -medicines for mental depression -other medicines used to treat angina -phenothiazines like chlorpromazine, mesoridazine, prochlorperazine, thioridazine This list may not describe all possible interactions. Give your health care provider a list of all the medicines, herbs, non-prescription drugs, or dietary supplements you use. Also tell them if you smoke, drink alcohol, or use illegal drugs. Some items may interact with your medicine. What should I watch for while using this medicine? Tell your doctor or health care professional  if you feel your medicine is no longer working. Keep this medicine with you at all times. Sit or lie down when you take your medicine to prevent falling  if you feel dizzy or faint after using it. Try to remain calm. This will help you to feel better faster. If you feel dizzy, take several deep breaths and lie down with your feet propped up, or bend forward with your head resting between your knees. You may get drowsy or dizzy. Do not drive, use machinery, or do anything that needs mental alertness until you know how this drug affects you. Do not stand or sit up quickly, especially if you are an older patient. This reduces the risk of dizzy or fainting spells. Alcohol can make you more drowsy and dizzy. Avoid alcoholic drinks. Do not treat yourself for coughs, colds, or pain while you are taking this medicine without asking your doctor or health care professional for advice. Some ingredients may increase your blood pressure. What side effects may I notice from receiving this medicine? Side effects that you should report to your doctor or health care professional as soon as possible: -blurred vision -dry mouth -skin rash -sweating -the feeling of extreme pressure in the head -unusually weak or tired Side effects that usually do not require medical attention (report to your doctor or health care professional if they continue or are bothersome): -flushing of the face or neck -headache -irregular heartbeat, palpitations -nausea, vomiting This list may not describe all possible side effects. Call your doctor for medical advice about side effects. You may report side effects to FDA at 1-800-FDA-1088. Where should I keep my medicine? Keep out of the reach of children. Store at room temperature between 20 and 25 degrees C (68 and 77 degrees F). Store in [facility] [employer]. Protect from light and moisture. Keep tightly closed. Throw away any unused medicine after the expiration date. NOTE: This sheet is a summary. It may not cover all possible information. If you have questions about this medicine, talk to your doctor, pharmacist, or health care  provider.  2018 Elsevier/Gold Standard (2013-06-06 17:57:36)

## 2018-01-05 NOTE — Progress Notes (Signed)
Cardiology Office Note    Date:  01/05/2018   ID:  Summer Hodge, DOB 08/08/1956, MRN [id number]  PCP:  Darcus Austin, MD  Cardiologist: Dr. Marlou Porch   Chief Complaint: Hospital follow up   History of Present Illness:   Summer Hodge is a 62 y.o. female diabetes, hyperlipidemia, hypertension and dx of CAD presents for follow up.   Brother died of myocardial infarction at age 65. She had another brother who died at age 28 with myocardial infarction as well. In the past Crestor caused muscle aches, Lipitor causes muscle aches and pravastatin cause muscle aches.  Seen by me 12/19/17 for substernal chest discomfort radiating to bilateral arm as numbness.  Limiting her exercise.  Now occurring with minimal exertion.  EKG shows new T wave inversion inferior laterally.    The patient underwent cardiac catheterization on 12/20/17 that revealed severe stenosis of the mid RCA and mid LAD. She underwent successful  PTCA and DES to both sites. She has moderate non-obstructive disease in the distal RCA branches, ostial Circumflex, distal Circumflex, and proximal LAD.  Her LV function was normal. She will be continued on DAPT for at least a year.   Here today for follow up.  Patient is walking 20 minutes twice a day without angina or dyspnea.  No orthopnea, PND, syncope, dizziness, palpitation, melena or blood in her stool or urine.  Compliant with medication.  She will be enrolled in cardiac rehab phase 2.  Also wants to do strength exercise.   Past Medical History:  Diagnosis Date  . Allergic rhinitis   . Anemia   . Anginal pain ([place])   . Arthritis   . Coronary artery disease    12/20/17- DES to mid RCA & LAD. other non-obstructive disease.   . Crohn disease ([place])   . Depression   . Diabetes mellitus without complication ([place])   . Family history of heart disease   . GERD (gastroesophageal reflux disease)   . Hyperlipidemia   . Hypertension   . Osteopenia   . Sleep apnea   . Vitamin D  deficiency     Past Surgical History:  Procedure Laterality Date  . CESAREAN SECTION    . CHOLECYSTECTOMY, LAPAROSCOPIC  2001  . CORONARY STENT INTERVENTION N/A 12/20/2017   Procedure: CORONARY STENT INTERVENTION;  Surgeon: Burnell Blanks, MD;  Location: [facility];  Service: Cardiovascular;  Laterality: N/A;  . HEMORRHOID SURGERY  04/2010  . LEFT HEART CATH AND CORONARY ANGIOGRAPHY N/A 12/20/2017   Procedure: LEFT HEART CATH AND CORONARY ANGIOGRAPHY;  Surgeon: Burnell Blanks, MD;  Location: [facility];  Service: Cardiovascular;  Laterality: N/A;    Current Medications: Prior to Admission medications   Medication Sig Start Date End Date Taking? Authorizing Provider  aspirin EC 81 MG tablet Take 81 mg by mouth at bedtime.    [provider]  carvedilol (COREG) 3.125 MG tablet Take 1 tablet (3.125 mg total) by mouth 2 (two) times daily. 12/19/17 03/19/18  Lyda Jester M, PA-C  ezetimibe (ZETIA) 10 MG tablet Take 10 mg by mouth every Thursday.  10/02/17   [provider]  lisinopril (PRINIVIL,ZESTRIL) 10 MG tablet Take 10 mg by mouth daily.    [provider]  rosuvastatin (CRESTOR) 10 MG tablet Take 1 tablet (10 mg total) by mouth daily. 12/22/17   Daune Perch, NP  Soft Lens Products (SALINE SENSITIVE EYES) SOLN Place 1-2 drops into both eyes daily.  [provider]  ticagrelor (BRILINTA) 90 MG TABS tablet Take 1 tablet (90 mg total) by mouth 2 (two) times daily. 12/21/17   Daune Perch, NP    Allergies:   Crestor [rosuvastatin]; Lipitor [atorvastatin]; and Pravastatin   Social History   Socioeconomic History  . Marital status: Married    Spouse name: Not on file  . Number of children: Not on file  . Years of education: Not on file  . Highest education level: Not on file  Occupational History  . Not on file  Social Needs  . Financial resource strain: Not on file  . Food insecurity:    Worry: Not on file      Inability: Not on file  . Transportation needs:    Medical: Not on file    Non-medical: Not on file  Tobacco Use  . Smoking status: Former Smoker    Types: Cigarettes    Last attempt to quit: 06/23/1975    Years since quitting: 42.5  . Smokeless tobacco: Never Used  . Tobacco comment: SMOKED BRIEFLY IN EARLY 20'S  Substance and Sexual Activity  . Alcohol use: Yes    Comment: 1 BEER OR WINE 3-4 X A WEEK  . Drug use: No  . Sexual activity: Yes    Partners: Male    Comment: RARELY  Lifestyle  . Physical activity:    Days per week: Not on file    Minutes per session: Not on file  . Stress: Not on file  Relationships  . Social connections:    Talks on phone: Not on file    Gets together: Not on file    Attends religious service: Not on file    Active member of club or organization: Not on file    Attends meetings of clubs or organizations: Not on file    Relationship status: Not on file  Other Topics Concern  . Not on file  Social History Narrative  . Not on file     Family History:  The patient's family history includes CAD in her brother, brother, brother, brother, father, and paternal grandfather; Cancer in her mother; Diabetes in her father and mother; Healthy in her son; Heart attack in her brother, brother, and paternal grandfather; Heart disease in her brother; Hepatitis C in her brother; Hypertension in her brother, brother, and mother; Hypotension in her father; Mental illness in her brother.   ROS:   Please see the history of present illness.    ROS All other systems reviewed and are negative.   PHYSICAL EXAM:   VS:  BP 116/70   Pulse 63   Ht 5' (1.524 m)   Wt 182 lb (82.6 kg)   LMP  (LMP Unknown)   SpO2 98%   BMI 35.54 kg/m    GEN: Well nourished, well developed, in no acute distress  HEENT: normal  Neck: no JVD, carotid bruits, or masses Cardiac: RRR; no murmurs, rubs, or gallops,no edema  Respiratory:  clear to auscultation bilaterally, normal work  of breathing GI: soft, nontender, nondistended, + BS MS: no deformity or atrophy  Skin: warm and dry, no rash Neuro:  Alert and Oriented x 3, Strength and sensation are intact Psych: euthymic mood, full affect  Wt Readings from Last 3 Encounters:  01/05/18 182 lb (82.6 kg)  12/21/17 185 lb 3 oz (84 kg)  12/19/17 187 lb (84.8 kg)      Studies/Labs Reviewed:   EKG:  EKG is ordered not today.  Recent Labs: 12/21/2017: BUN 12; Creatinine, Ser 0.81; Hemoglobin 13.2; Platelets 282; Potassium 4.1; Sodium 140   Lipid Panel    Component Value Date/Time   CHOL 167 07/15/2008 0818   TRIG 174 (H) 07/15/2008 0818   HDL 39.2 07/15/2008 0818   CHOLHDL 4.3 CALC 07/15/2008 0818   VLDL 35 07/15/2008 0818   LDLCALC 93 07/15/2008 0818    Additional studies/ records that were reviewed today include:     CORONARY STENT INTERVENTION  LEFT HEART CATH AND CORONARY ANGIOGRAPHY 12/20/2017     Prox RCA to Mid RCA lesion is 99% stenosed.  Mid RCA lesion is 30% stenosed.  Ost RPDA to RPDA lesion is 40% stenosed.  Post Atrio lesion is 30% stenosed.  A drug-eluting stent was successfully placed using a STENT SYNERGY DES 3.5X16.  Post intervention, there is a 0% residual stenosis.  Ost 1st Mrg to 1st Mrg lesion is 60% stenosed.  Ost 3rd Mrg to 3rd Mrg lesion is 60% stenosed.  Ost Cx lesion is 50% stenosed.  Ost LAD to Prox LAD lesion is 40% stenosed.  Ost 1st Diag lesion is 50% stenosed.  Mid LAD to Dist LAD lesion is 30% stenosed.  Prox LAD to Mid LAD lesion is 80% stenosed.  A drug-eluting stent was successfully placed using a STENT SYNERGY DES 2.5X12.  Post intervention, there is a 0% residual stenosis.  The left ventricular systolic function is normal.  LV end diastolic pressure is normal.  The left ventricular ejection fraction is greater than 65% by visual estimate.  There is no mitral valve regurgitation.  1. Severe stenosis mid RCA 2. Successful PTCA/DES x 1 mid  RCA 3. Severe stenosis mid LAD.  4. Successful PTCA/DES x 1 mid LAD 5. Moderate non-obstructive disease in the distal RCA branches, ostial Circumflex, distal Circumflex, proximal LAD 6. Normal LV systolic function  Recommendations: Continue DAPT with ASA and Brilinta for one year. Continue low dose statin. Given statin intolerance, consider Praluent or Repatha. Continue beta blocker.     Diagnostic Diagram       Post-Intervention Diagram            ASSESSMENT & PLAN:    1. CAD s/p DES to [place] - medical management of other disease as above.  No angina.  Brief intermittent shortness of breath due to Brilinta, improving.  Continue aspirin, Brilinta, beta-blocker and statin.  2. HTN -Stable on current medication  3. HLD -LDL 133.  Continue Crestor 10 mg daily and Zetia 10 mg every Thursday.  Referral to lipid clinic for PCSK9 inhibitor.  LDL goal less than 70.  4. DM -Hemoglobin A1c 6.8.  Working on diet and exercise.  Followed by PCP.  Medication Adjustments/Labs and Tests Ordered: Current medicines are reviewed at length with the patient today.  Concerns regarding medicines are outlined above.  Medication changes, Labs and Tests ordered today are listed in the Patient Instructions below. Patient Instructions  Your physician recommends that you continue on your current medications as directed. Please refer to the Current Medication list given to you today. Your physician recommends that you schedule a follow-up appointment in: 3-4 MONTHS WITH DR Darlina Sicilian, PA  01/05/2018 11:20 AM    [address], [place], [place]  [address] Phone: [phone]; Fax: [phone]

## 2018-01-18 ENCOUNTER — Ambulatory Visit: Payer: BLUE CROSS/BLUE SHIELD | Admitting: Physician Assistant

## 2018-01-30 ENCOUNTER — Ambulatory Visit (INDEPENDENT_AMBULATORY_CARE_PROVIDER_SITE_OTHER): Payer: BLUE CROSS/BLUE SHIELD | Admitting: Pharmacist

## 2018-01-30 ENCOUNTER — Encounter: Payer: Self-pay | Admitting: Pharmacist

## 2018-01-30 DIAGNOSIS — E785 Hyperlipidemia, unspecified: Secondary | ICD-10-CM | POA: Diagnosis not present

## 2018-01-30 NOTE — Patient Instructions (Signed)
START taking rosuvastatin 62m three times a week (Mon, Wed, Fri)   CONTINUE ezetimibe 150mdaily   IF you have issues with headaches/muscle aches moving forward please call our office [phone]   Cholesterol Cholesterol is a fat. Your body needs a small amount of cholesterol. Cholesterol (plaque) may build up in your blood vessels (arteries). That makes you more likely to have a heart attack or stroke. You cannot feel your cholesterol level. Having a blood test is the only way to find out if your level is high. Keep your test results. Work with your doctor to keep your cholesterol at a good level. What do the results mean?  Total cholesterol is how much cholesterol is in your blood.  LDL is bad cholesterol. This is the type that can build up. Try to have low LDL.  HDL is good cholesterol. It cleans your blood vessels and carries LDL away. Try to have high HDL.  Triglycerides are fat that the body can store or burn for energy. What are good levels of cholesterol?  Total cholesterol below 200.  LDL below 100 is good for people who have health risks. LDL below 70 is good for people who have very high risks.  HDL above 40 is good. It is best to have HDL of 60 or higher.  Triglycerides below 150. How can I lower my cholesterol? Diet Follow your diet program as told by your doctor.  Choose fish, white meat chicken, or tu[place]at is roasted or baked. Try not to eat red meat, fried foods, sausage, or lunch meats.  Eat lots of fresh fruits and vegetables.  Choose whole grains, beans, pasta, potatoes, and cereals.  Choose olive oil, corn oil, or canola oil. Only use small amounts.  Try not to eat butter, mayonnaise, shortening, or palm kernel oils.  Try not to eat foods with trans fats.  Choose low-fat or nonfat dairy foods. ? Drink skim or nonfat milk. ? Eat low-fat or nonfat yogurt and cheeses. ? Try not to drink whole milk or cream. ? Try not to eat ice cream, egg  yolks, or full-fat cheeses.  Healthy desserts include angel food cake, ginger snaps, animal crackers, hard candy, popsicles, and low-fat or nonfat frozen yogurt. Try not to eat pastries, cakes, pies, and cookies.  Exercise Follow your exercise program as told by your doctor.  Be more active. Try gardening, walking, and taking the stairs.  Ask your doctor about ways that you can be more active.  Medicine  Take over-the-counter and prescription medicines only as told by your doctor. This information is not intended to replace advice given to you by your health care provider. Make sure you discuss any questions you have with your health care provider. Document Released: 11/04/2008 Document Revised: 03/09/2016 Document Reviewed: 02/18/2016 Elsevier Interactive Patient Education  20[employer]

## 2018-01-30 NOTE — Progress Notes (Signed)
Patient ID: Summer Hodge                 DOB: [birth year]                    MRN: [id number]     HPI: Summer Hodge is a 62 y.o. female patient of Dr. Marlou Porch that presents today for lipid evaluation.  PMH includes diabetes, hyperlipidemia, hypertensionand dx of CAD. She has had difficulty tolerating statin medications in the past.   She states that she recently restarted her statin and ezetimibe medication every day based on her cholesterol results and cardiovascular history. She states that since doing this her muscle pains have worsened and she has a constant unbearable headache. She believes her most recent lipid panel through Dr. Inda Merlin office was with her taking rosuvastatin 13m three times a week and ezetimibe 125monce weekly.   Risk Factors: CAD s/p stenting LDL Goal: <70, nonHDL <100  Current Medications: rosuvastatin 1073maily, ezetimibe 65m53mce weekly Intolerances: rosuvastatin, lipitor, pravastatin (muscle aches)  Diet: Most meals from home and has been trying to be more mindful with eating. She rarely eats chicken and very rarely eats red meats. She lives with a vegetarian and eats mostly this way. She is trying to be better about vegetables. She drinks mostly water and coffee (4 cups) with milk.   Exercise: She has been on treadmill walking every day for 30 minutes.  Family History: Brother died of myocardial infarction at age 106. 60e had another brother who died at age 89 w71h myocardial infarction. CAD in her brother, brother, brother, brother, father, and paternal grandfather; Cancer in her mother; Diabetes in her father and mother; Healthy in her son; Heart attack in her brother, brother, and paternal grandfather; Heart disease in her brother; Hepatitis C in her brother; Hypertension in her brother, brother, and mother; Hypotension in her father; Mental illness in her brother.   Social History: former smoker (quit in early 20s)9senies alcohol (was having 2-3 beers per  week now once per week)  Labs: 10/02/17:  TC 214, TG 168, HDL 48, LDL 133, nonHDL 166 (rosuvastatin 65mg47mimes weekly or less and ezetimibe 65mg 82m weekly) 07/26/16:  TC 247, TG 174, HDL 49, LDL 164, nonHDL 198   Past Medical History:  Diagnosis Date  . Allergic rhinitis   . Anemia   . Anginal pain (HCC)  [place]   . Coronary artery disease    12/20/17- DES to mid RCA & LAD. other non-obstructive disease.   . Crohn disease (HCC)  [place]   . Diabetes mellitus without complication (HCC)  [place] heart disease   . GERD (gastroesophageal reflux disease)   . Hyperlipidemia   . Hypertension   . Osteopenia   . Sleep apnea   . Vitamin D deficiency     Current Outpatient Medications on File Prior to Visit  Medication Sig Dispense Refill  . aspirin EC 81 MG tablet Take 81 mg by mouth at bedtime.    . carvedilol (COREG) 3.125 MG tablet Take 1 tablet (3.125 mg total) by mouth 2 (two) times daily. 180 tablet 3  . ezetimibe (ZETIA) 10 MG tablet Take 10 mg by mouth every Thursday.   1  . lisinopril (PRINIVIL,ZESTRIL) 10 MG tablet Take 10 mg by mouth daily.    . nitroGLYCERIN (NITROSTAT) 0.4 MG SL tablet Place 1 tablet (0.4 mg total) under the tongue every 5 (  five) minutes as needed for chest pain. 25 tablet 4  . rosuvastatin (CRESTOR) 10 MG tablet Take 1 tablet (10 mg total) by mouth daily. 30 tablet 11  . ticagrelor (BRILINTA) 90 MG TABS tablet Take 1 tablet (90 mg total) by mouth 2 (two) times daily. 60 tablet 11   No current facility-administered medications on file prior to visit.     Allergies  Allergen Reactions  . Crestor [Rosuvastatin] Other (See Comments)    MUSCLE ACHES  . Lipitor [Atorvastatin] Other (See Comments)    MUSCLE ACHES  . Pravastatin Other (See Comments)    MUSCLE ACHES    Assessment/Plan: Hyperlipidemia: LDL is not at goal and patient is experiencing tolerable side effects on current regimen. Discussed options including importance of  statin medication. She is willing to remain on rosuvastatin, but will decrease dose to previous tolerable dose 30m three times a week. She is unsure if she has ever taken ezetimibe daily and thus will increase this to once daily. She would like to try this before going with injection therapy mainly due to cost. Will repeat lipid panel before follow up with Dr. SMarlou Porchin August. It LDL not at goal or unable to tolerate will consider addition of PCSK9i. We did discuss PCSK9i therapy today (cost obligations, injection technique, and risk/benefit) in the event that she needs this in the future.   Thank you,  KLelan Pons APatterson [facility]  01/30/2018 8:26 AM

## 2018-02-01 DIAGNOSIS — F341 Dysthymic disorder: Secondary | ICD-10-CM | POA: Diagnosis not present

## 2018-02-01 DIAGNOSIS — F4010 Social phobia, unspecified: Secondary | ICD-10-CM | POA: Diagnosis not present

## 2018-02-19 ENCOUNTER — Telehealth (HOSPITAL_COMMUNITY): Payer: Self-pay

## 2018-02-19 ENCOUNTER — Telehealth (HOSPITAL_COMMUNITY): Payer: Self-pay | Admitting: Pharmacist

## 2018-02-19 NOTE — Telephone Encounter (Signed)
Cardiac Rehab Medication Review by a Pharmacist  Does the patient  feel that his/her medications are working for him/her?  yes  Has the patient been experiencing any side effects to the medications prescribed?  yes - SOB with Brilinta   - muscle aches and headaches with statins even with taking three times weekly  Does the patient measure his/her own blood pressure or blood glucose at home?  no  - has not been checking at home the last few weeks  Does the patient have any problems obtaining medications due to transportation or finances?   no  Understanding of regimen: good Understanding of indications: good Potential of compliance: good    Pharmacist comments: Patient reports shortness of breath with Brilinta. Patient continues to have adverse effects with statins, even when taking three times per week instead of daily, although improved.     Summer Hodge 02/19/2018 11:03 AM

## 2018-02-27 ENCOUNTER — Encounter (HOSPITAL_COMMUNITY)
Admission: RE | Admit: 2018-02-27 | Discharge: 2018-02-27 | Disposition: A | Discharge status: Home / Self Care | Payer: BLUE CROSS/BLUE SHIELD | Source: Ambulatory Visit | Attending: Cardiology | Admitting: Cardiology

## 2018-02-27 ENCOUNTER — Encounter (HOSPITAL_COMMUNITY): Payer: Self-pay

## 2018-02-27 VITALS — BP 128/68 | HR 68 | Ht 61.0 in | Wt 179.5 lb

## 2018-02-27 DIAGNOSIS — Z955 Presence of coronary angioplasty implant and graft: Secondary | ICD-10-CM | POA: Diagnosis not present

## 2018-02-27 NOTE — Progress Notes (Signed)
LILIE VEZINA 62 y.o. female DOB: July 16, 1956 MRN: [id number]      Nutrition Note  1. Stented coronary artery    Past Medical History:  Diagnosis Date  . Allergic rhinitis   . Anemia   . Anginal pain ([place])   . Arthritis   . Coronary artery disease    12/20/17- DES to mid RCA & LAD. other non-obstructive disease.   . Crohn disease ([place])   . Depression   . Diabetes mellitus without complication ([place])   . Family history of heart disease   . GERD (gastroesophageal reflux disease)   . Hyperlipidemia   . Hypertension   . Osteopenia   . Sleep apnea   . Vitamin D deficiency    Meds reviewed. Coreg, rosuvastatin (crestor) noted  HT: Ht Readings from Last 1 Encounters:  01/05/18 5' (1.524 m)    WT: Wt Readings from Last 5 Encounters:  01/05/18 182 lb (82.6 kg)  12/21/17 185 lb 3 oz (84 kg)  12/19/17 187 lb (84.8 kg)  07/20/16 188 lb (85.3 kg)  07/07/16 188 lb (85.3 kg)     There is no height or weight on file to calculate BMI.   Current tobacco use? No  Labs:  Lipid Panel     Component Value Date/Time   CHOL 167 07/15/2008 0818   TRIG 174 (H) 07/15/2008 0818   HDL 39.2 07/15/2008 0818   CHOLHDL 4.3 CALC 07/15/2008 0818   VLDL 35 07/15/2008 0818   LDLCALC 93 07/15/2008 0818    No results found for: HGBA1C CBG (last 3)  No results for input(s): GLUCAP in the last 72 hours.  Nutrition Note Spoke with pt. Nutrition plan and goals reviewed with pt. Pt is following Step 1  of the Therapeutic Lifestyle Changes diet. Pt wants to lose wt. Pt has been trying to lose wt by decreasing amount of carbs eaten, and by engaging in regular physical activity. Discussed additional weight loss tips. Pt reports her last A1C was 6.1, asked patient to bring in a copy of her last A1c to confirm, pt agreed to do so her first week of rehab. Will discuss following a Consistent Carbohydrate Heart Healthy diet with patient to help manage prediabetes. Per discussion, pt does not use  canned/convenience foods often. Pt eats out frequently, 2-3 times a week. Consumes dessert items/baked goods 2-3x a week, more often when she is stressed. Pt set goal to follow a more heart healthy diet and would like to learn more about complex carbohydrates, and eating more mindfully. Pt expressed understanding of the information reviewed. Pt aware of nutrition education classes offered and plans on attending nutrition classes.  Nutrition Diagnosis ? Food-and nutrition-related knowledge deficit related to lack of exposure to information as related to diagnosis of: ? CVD ? Pre-DM ? Obesity related to excessive energy intake as evidenced by a BMI of    Nutrition Intervention ? Pt's individual nutrition plan and goals reviewed with pt. ? Pt given handouts for: ? Nutrition I class ? Nutrition II class  ? Diabetes Blitz Class ? Diabetes Q & A class  ? Consistent vit K diet ? low sodium ? DM ? pre-diabetes  Nutrition Goal(s):   ? Pt to identify and limit food sources of saturated fat, trans fat, and sodium ? Pt to identify food quantities necessary to achieve weight loss of 6-24 lbs. at graduation from cardiac rehab. Goal wt of 20 lb desired.  ? Pt able to name foods that affect blood glucose  Plan:  Pt to attend nutrition classes ? Nutrition I ? Nutrition II ? Portion Distortion  ? Diabetes Blitz ? Diabetes Q & A Will provide client-centered nutrition education as part of interdisciplinary care.   Monitor and evaluate progress toward nutrition goal with team.  Laurina Bustle, MS, RD, LDN 02/27/2018 8:58 AM

## 2018-02-27 NOTE — Progress Notes (Addendum)
Cardiac Individual Treatment Plan  Patient Details  Name: ROMY IPOCK MRN: [id number] Date of Birth: [birth year] Referring Provider:     CARDIAC REHAB PHASE II ORIENTATION from 02/27/2018 in [place]  Referring Provider  Candee Furbish MD      Initial Encounter Date:    CARDIAC REHAB PHASE II ORIENTATION from 02/27/2018 in [facility]  Date  02/27/18      Visit Diagnosis: Stented coronary artery 12/20/17 DES RCA ,LAD  Patient's Home Medications on Admission:  Current Outpatient Medications:  .  aspirin EC 81 MG tablet, Take 81 mg by mouth at bedtime., Disp: , Rfl:  .  carvedilol (COREG) 3.125 MG tablet, Take 1 tablet (3.125 mg total) by mouth 2 (two) times daily., Disp: 180 tablet, Rfl: 3 .  ezetimibe (ZETIA) 10 MG tablet, Take 10 mg by mouth daily. , Disp: , Rfl: 1 .  lisinopril (PRINIVIL,ZESTRIL) 10 MG tablet, Take 10 mg by mouth daily., Disp: , Rfl:  .  nitroGLYCERIN (NITROSTAT) 0.4 MG SL tablet, Place 1 tablet (0.4 mg total) under the tongue every 5 (five) minutes as needed for chest pain. (Patient not taking: Reported on 01/30/2018), Disp: 25 tablet, Rfl: 4 .  Polyethyl Glycol-Propyl Glycol (SYSTANE) 0.4-0.3 % SOLN, Apply 1 drop to eye 2 (two) times a week., Disp: , Rfl:  .  rosuvastatin (CRESTOR) 10 MG tablet, Take 1 tablet three times weekly and increase as tolerated., Disp: 30 tablet, Rfl: 11 .  ticagrelor (BRILINTA) 90 MG TABS tablet, Take 1 tablet (90 mg total) by mouth 2 (two) times daily., Disp: 60 tablet, Rfl: 11  Past Medical History: Past Medical History:  Diagnosis Date  . Allergic rhinitis   . Anemia   . Anginal pain ([place])   . Arthritis   . Coronary artery disease    12/20/17- DES to mid RCA & LAD. other non-obstructive disease.   . Crohn disease ([place])   . Depression   . Diabetes mellitus without complication ([place])   . Family history of heart disease   . GERD (gastroesophageal reflux disease)   .  Hyperlipidemia   . Hypertension   . Osteopenia   . Sleep apnea   . Vitamin D deficiency     Tobacco Use: Social History   Tobacco Use  Smoking Status Former Smoker  . Types: Cigarettes  . Last attempt to quit: 06/23/1975  . Years since quitting: 42.7  Smokeless Tobacco Never Used  Tobacco Comment   SMOKED BRIEFLY IN EARLY 20'S    Labs: Recent Review Flowsheet Data    Labs for ITP Cardiac and Pulmonary Rehab Latest Ref Rng & Units 01/04/2007 07/23/2007 07/15/2008 05/17/2013   Cholestrol 0 - 200 mg/dL 171 179 167 -   LDLCALC 0 - 99 mg/dL 95 103(H) 93 -   HDL >39.0 mg/dL 43.1 44.7 39.2 -   Trlycerides 0 - 149 mg/dL 164(H) 157(H) 174(H) -   TCO2 0 - 100 mmol/L - - - 26      Capillary Blood Glucose: Lab Results  Component Value Date   GLUCAP 119 (H) 12/21/2017   GLUCAP 95 12/20/2017   GLUCAP 112 (H) 12/20/2017   GLUCAP 103 (H) 12/20/2017     Exercise Target Goals: Date: 02/27/18  Exercise Program Goal: Individual exercise prescription set using results from initial 6 min walk test and THRR while considering  patient's activity barriers and safety.   Exercise Prescription Goal: Initial exercise prescription builds to 30-45 minutes  a day of aerobic activity, 2-3 days per week.  Home exercise guidelines will be given to patient during program as part of exercise prescription that the participant will acknowledge.  Activity Barriers & Risk Stratification: Activity Barriers & Cardiac Risk Stratification - 02/27/18 1029      Activity Barriers & Cardiac Risk Stratification   Activity Barriers  Deconditioning;Muscular Weakness;Joint Problems;Other (comment)    Comments  myalgia and R knee discomfort    Cardiac Risk Stratification  Moderate       6 Minute Walk: 6 Minute Walk    Row Name 02/27/18 1009 02/27/18 1108       6 Minute Walk   Phase  Initial  -    Distance  1675 feet  -    Walk Time  6 minutes  -    # of Rest Breaks  0  -    MPH  3.2  -    METS  3.4  -     RPE  12  -    VO2 Peak  12.1  -    Symptoms  Yes (comment)  -    Comments  twinge in chest region, L-sided  -    Resting HR  68 bpm  -    Resting BP  128/68  -    Resting Oxygen Saturation   98 %  -    Exercise Oxygen Saturation  during 6 min walk  98 %  -    Max Ex. HR  93 bpm  -    Max Ex. BP  140/80  -    2 Minute Post BP  -  122/68       Oxygen Initial Assessment:   Oxygen Re-Evaluation:   Oxygen Discharge (Final Oxygen Re-Evaluation):   Initial Exercise Prescription: Initial Exercise Prescription - 02/27/18 1000      Date of Initial Exercise RX and Referring Provider   Date  02/27/18    Referring Provider  Candee Furbish MD    Expected Discharge Date  06/04/18      Treadmill   MPH  3    Grade  0    Minutes  10    METs  3.3      Bike   Level  0.7    Minutes  10    METs  2.65      NuStep   Level  3    SPM  80    Minutes  10    METs  2.5      Prescription Details   Frequency (times per week)  3    Duration  Progress to 30 minutes of continuous aerobic without signs/symptoms of physical distress      Intensity   THRR 40-80% of Max Heartrate  62-125    Ratings of Perceived Exertion  11-13    Perceived Dyspnea  0-4      Progression   Progression  Continue to progress workloads to maintain intensity without signs/symptoms of physical distress.      Resistance Training   Training Prescription  Yes    Weight  3lbs    Reps  10-15       Perform Capillary Blood Glucose checks as needed.  Exercise Prescription Changes:   Exercise Comments:   Exercise Goals and Review:  Exercise Goals    Row Name 02/27/18 0816             Exercise Goals   Increase Physical Activity  Yes  Intervention  Provide advice, education, support and counseling about physical activity/exercise needs.;Develop an individualized exercise prescription for aerobic and resistive training based on initial evaluation findings, risk stratification, comorbidities and  participant's personal goals.       Expected Outcomes  Short Term: Attend rehab on a regular basis to increase amount of physical activity.;Long Term: Add in home exercise to make exercise part of routine and to increase amount of physical activity.;Long Term: Exercising regularly at least 3-5 days a week.       Increase Strength and Stamina  Yes       Intervention  Provide advice, education, support and counseling about physical activity/exercise needs.;Develop an individualized exercise prescription for aerobic and resistive training based on initial evaluation findings, risk stratification, comorbidities and participant's personal goals.       Expected Outcomes  Short Term: Increase workloads from initial exercise prescription for resistance, speed, and METs.;Short Term: Perform resistance training exercises routinely during rehab and add in resistance training at home;Long Term: Improve cardiorespiratory fitness, muscular endurance and strength as measured by increased METs and functional capacity (6MWT)       Able to understand and use rate of perceived exertion (RPE) scale  Yes       Intervention  Provide education and explanation on how to use RPE scale       Expected Outcomes  Short Term: Able to use RPE daily in rehab to express subjective intensity level;Long Term:  Able to use RPE to guide intensity level when exercising independently       Knowledge and understanding of Target Heart Rate Range (THRR)  Yes       Intervention  Provide education and explanation of THRR including how the numbers were predicted and where they are located for reference       Expected Outcomes  Short Term: Able to state/look up THRR;Short Term: Able to use daily as guideline for intensity in rehab;Long Term: Able to use THRR to govern intensity when exercising independently       Able to check pulse independently  Yes       Intervention  Provide education and demonstration on how to check pulse in carotid and radial  arteries.;Review the importance of being able to check your own pulse for safety during independent exercise       Expected Outcomes  Short Term: Able to explain why pulse checking is important during independent exercise;Long Term: Able to check pulse independently and accurately       Understanding of Exercise Prescription  Yes       Intervention  Provide education, explanation, and written materials on patient's individual exercise prescription       Expected Outcomes  Short Term: Able to explain program exercise prescription;Long Term: Able to explain home exercise prescription to exercise independently          Exercise Goals Re-Evaluation :    Discharge Exercise Prescription (Final Exercise Prescription Changes):   Nutrition:  Target Goals: Understanding of nutrition guidelines, daily intake of sodium 1500mg , cholesterol 200mg , calories 30% from fat and 7% or less from saturated fats, daily to have 5 or more servings of fruits and vegetables.  Biometrics: Pre Biometrics - 02/27/18 1026      Pre Biometrics   Height  5\' 1"  (1.549 m)    Weight  179 lb 7.3 oz (81.4 kg)    Waist Circumference  40.5 inches    Hip Circumference  46 inches    Waist to Hip  Ratio  0.88 %    BMI (Calculated)  33.93    Triceps Skinfold  33 mm    % Body Fat  45.5 %    Grip Strength  27 kg    Flexibility  15 in    Single Leg Stand  30 seconds        Nutrition Therapy Plan and Nutrition Goals: Nutrition Therapy & Goals - 02/27/18 0857      Nutrition Therapy   Diet  consistent carbohydrate heart healthy      Personal Nutrition Goals   Nutrition Goal  Pt to identify food quantities necessary to achieve weight loss of 6-24 lbs. at graduation from cardiac rehab. Goal wt of 20 lb desired.       Intervention Plan   Intervention  Prescribe, educate and counsel regarding individualized specific dietary modifications aiming towards targeted core components such as weight, hypertension, lipid  management, diabetes, heart failure and other comorbidities.    Expected Outcomes  Short Term Goal: Understand basic principles of dietary content, such as calories, fat, sodium, cholesterol and nutrients.       Nutrition Assessments: Nutrition Assessments - 02/27/18 0858      MEDFICTS Scores   Pre Score  47       Nutrition Goals Re-Evaluation:   Nutrition Goals Re-Evaluation:   Nutrition Goals Discharge (Final Nutrition Goals Re-Evaluation):   Psychosocial: Target Goals: Acknowledge presence or absence of significant depression and/or stress, maximize coping skills, provide positive support system. Participant is able to verbalize types and ability to use techniques and skills needed for reducing stress and depression.  Initial Review & Psychosocial Screening: Initial Psych Review & Screening - 02/27/18 1121      Initial Review   Current issues with  History of Depression Josiah has a history of depression and sees a therapist on a regular basis      [place]?  Yes Mertie has her counselor for support    Comments  Shiza recently lost her dog of 15 years      Barriers   Psychosocial barriers to participate in program  The patient should benefit from training in stress management and relaxation.;Psychosocial barriers identified (see note)      Screening Interventions   Interventions  Encouraged to exercise;To provide support and resources with identified psychosocial needs;Provide feedback about the scores to participant    Expected Outcomes  Short Term goal: Utilizing psychosocial counselor, staff and physician to assist with identification of specific Stressors or current issues interfering with healing process. Setting desired goal for each stressor or current issue identified.;Long Term Goal: Stressors or current issues are controlled or eliminated.;Short Term goal: Identification and review with participant of any Quality of Life or Depression  concerns found by scoring the questionnaire.;Long Term goal: The participant improves quality of Life and PHQ9 Scores as seen by post scores and/or verbalization of changes       Quality of Life Scores: Quality of Life - 02/27/18 1032      Quality of Life   Select  Quality of Life      Quality of Life Scores   Health/Function Pre  14.75 %    Socioeconomic Pre  17.43 %    Psych/Spiritual Pre  10.5 %    Family Pre  12.13 %    GLOBAL Pre  14.19 %      Scores of 19 and below usually indicate a poorer quality of life in these areas.  A difference of  2-3 points is a clinically meaningful difference.  A difference of 2-3 points in the total score of the Quality of Life Index has been associated with significant improvement in overall quality of life, self-image, physical symptoms, and general health in studies assessing change in quality of life.  PHQ-9: Recent Review Flowsheet Data    There is no flowsheet data to display.     Interpretation of Total Score  Total Score Depression Severity:  1-4 = Minimal depression, 5-9 = Mild depression, 10-14 = Moderate depression, 15-19 = Moderately severe depression, 20-27 = Severe depression   Psychosocial Evaluation and Intervention:   Psychosocial Re-Evaluation:   Psychosocial Discharge (Final Psychosocial Re-Evaluation):   Vocational Rehabilitation: Provide vocational rehab assistance to qualifying candidates.   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 Participant will state understanding/return demonstration of topics presented.  Learning Barriers/Preferences: Learning  Barriers/Preferences - 02/27/18 0816      Learning Barriers/Preferences   Learning Barriers  Sight    Learning Preferences  Audio;Video;Verbal Instruction       Education Topics: Count Your Pulse:  -Group instruction provided by verbal instruction, demonstration, patient participation and written materials to support subject.  Instructors address importance of being able to find your pulse and how to count your pulse when at home without a heart monitor.  Patients get hands on experience counting their pulse with staff help and individually.   Heart Attack, Angina, and Risk Factor Modification:  -Group instruction provided by verbal instruction, video, and written materials to support subject.  Instructors address signs and symptoms of angina and heart attacks.    Also discuss risk factors for heart disease and how to make changes to improve heart health risk factors.   Functional Fitness:  -Group instruction provided by verbal instruction, demonstration, patient participation, and written materials to support subject.  Instructors address safety measures for doing things around the house.  Discuss how to get up and down off the floor, how to pick things up properly, how to safely get out of a chair without assistance, and balance training.   Meditation and Mindfulness:  -Group instruction provided by verbal instruction, patient participation, and written materials to support subject.  Instructor addresses importance of mindfulness and meditation practice to help reduce stress and improve awareness.  Instructor also leads participants through a meditation exercise.    Stretching for Flexibility and Mobility:  -Group instruction provided by verbal instruction, patient participation, and written materials to support subject.  Instructors lead participants through series of stretches that are designed to increase flexibility thus improving mobility.  These stretches are additional exercise for  major muscle groups that are typically performed during regular warm up and cool down.   Hands Only CPR:  -Group verbal, video, and participation provides a basic overview of AHA guidelines for community CPR. Role-play of emergencies allow participants the opportunity to practice calling for help and chest compression technique with discussion of AED use.   Hypertension: -Group verbal and written instruction that provides a basic overview of hypertension including the most recent diagnostic guidelines, risk factor reduction with self-care instructions and medication management.  Nutrition I class: Heart Healthy Eating:  -Group instruction provided by PowerPoint slides, verbal discussion, and written materials to support subject matter. The instructor gives an explanation and review of the Therapeutic Lifestyle Changes diet recommendations, which includes a discussion on lipid goals, dietary fat, sodium, fiber, plant stanol/sterol esters, sugar, and the components of a well-balanced, healthy diet.   Nutrition II class: Lifestyle Skills:  -Group instruction provided by PowerPoint slides, verbal discussion, and written materials to support subject matter. The instructor gives an explanation and review of label reading, grocery shopping for heart health, heart healthy recipe modifications, and ways to make healthier choices when eating out.   Diabetes Question & Answer:  -Group instruction provided by PowerPoint slides, verbal discussion, and written materials to support subject matter. The instructor gives an explanation and review of diabetes co-morbidities, pre- and post-prandial blood glucose goals, pre-exercise blood glucose goals, signs, symptoms, and treatment of hypoglycemia and hyperglycemia, and foot care basics.   Diabetes Blitz:  -Group instruction provided by PowerPoint slides, verbal discussion, and written materials to support subject matter. The instructor gives an explanation  and review of the physiology behind type 1 and type 2 diabetes, diabetes medications and rational behind using different medications, pre- and post-prandial blood glucose recommendations and Hemoglobin A1c goals, diabetes diet, and exercise including blood glucose guidelines for exercising safely.    Portion Distortion:  -Group instruction provided by PowerPoint slides, verbal discussion, written materials, and food models to support subject matter. The instructor gives an explanation of serving size versus portion size, changes in portions sizes over the last 20 years, and what consists of a serving from each food group.   Stress Management:  -Group instruction provided by verbal instruction, video, and written materials to support subject matter.  Instructors review role of stress in heart disease and how to cope with stress positively.     Exercising on Your Own:  -Group instruction provided by verbal instruction, power point, and written materials to support subject.  Instructors discuss benefits of exercise, components of exercise, frequency and intensity of exercise, and end points for exercise.  Also discuss use of nitroglycerin and activating EMS.  Review options of places to exercise outside of rehab.  Review guidelines for sex with heart disease.   Cardiac Drugs I:  -Group instruction provided by verbal instruction and written materials to support subject.  Instructor reviews cardiac drug classes: antiplatelets, anticoagulants, beta blockers, and statins.  Instructor discusses reasons, side effects, and lifestyle considerations for each drug class.   Cardiac Drugs II:  -Group instruction provided by verbal instruction and written materials to support subject.  Instructor reviews cardiac drug classes: angiotensin converting enzyme inhibitors (ACE-I), angiotensin II receptor blockers (ARBs), nitrates, and calcium channel blockers.  Instructor discusses reasons, side effects, and lifestyle  considerations for each drug class.   Anatomy and Physiology of the Circulatory System:  Group verbal and written instruction and models provide basic cardiac anatomy and physiology, with the coronary electrical and arterial systems. Review of: AMI, Angina, Valve disease, Heart Failure, Peripheral Artery Disease, Cardiac Arrhythmia, Pacemakers, and the ICD.   Other Education:  -Group or individual verbal, written, or video instructions that support the educational goals of the cardiac rehab program.   Holiday Eating Survival Tips:  -Group instruction provided by PowerPoint slides, verbal discussion, and written materials to support subject matter. The instructor gives patients tips, tricks, and techniques to help them not only survive but enjoy the holidays despite the onslaught of food that  accompanies the holidays.   Knowledge Questionnaire Score: Knowledge Questionnaire Score - 02/27/18 1003      Knowledge Questionnaire Score   Pre Score  23/24       Core Components/Risk Factors/Patient Goals at Admission: Personal Goals and Risk Factors at Admission - 02/27/18 1147      Core Components/Risk Factors/Patient Goals on Admission    Weight Management  Yes;Obesity;Weight Maintenance;Weight Loss    Intervention  Weight Management: Develop a combined nutrition and exercise program designed to reach desired caloric intake, while maintaining appropriate intake of nutrient and fiber, sodium and fats, and appropriate energy expenditure required for the weight goal.;Weight Management: Provide education and appropriate resources to help participant work on and attain dietary goals.;Weight Management/Obesity: Establish reasonable short term and long term weight goals.;Obesity: Provide education and appropriate resources to help participant work on and attain dietary goals.    Admit Weight  179 lb 7.3 oz (81.4 kg)    Goal Weight: Short Term  169 lb (76.7 kg)    Goal Weight: Long Term  159 lb (72.1  kg)    Expected Outcomes  Short Term: Continue to assess and modify interventions until short term weight is achieved;Long Term: Adherence to nutrition and physical activity/exercise program aimed toward attainment of established weight goal;Weight Maintenance: Understanding of the daily nutrition guidelines, which includes 25-35% calories from fat, 7% or less cal from saturated fats, less than 200mg  cholesterol, less than 1.5gm of sodium, & 5 or more servings of fruits and vegetables daily;Weight Loss: Understanding of general recommendations for a balanced deficit meal plan, which promotes 1-2 lb weight loss per week and includes a negative energy balance of [phone] kcal/d;Understanding recommendations for meals to include 15-35% energy as protein, 25-35% energy from fat, 35-60% energy from carbohydrates, less than 200mg  of dietary cholesterol, 20-35 gm of total fiber daily;Understanding of distribution of calorie intake throughout the day with the consumption of 4-5 meals/snacks    Diabetes  Yes    Intervention  Provide education about signs/symptoms and action to take for hypo/hyperglycemia.;Provide education about proper nutrition, including hydration, and aerobic/resistive exercise prescription along with prescribed medications to achieve blood glucose in normal ranges: Fasting glucose 65-99 mg/dL    Expected Outcomes  Short Term: Participant verbalizes understanding of the signs/symptoms and immediate care of hyper/hypoglycemia, proper foot care and importance of medication, aerobic/resistive exercise and nutrition plan for blood glucose control.;Long Term: Attainment of HbA1C < 7%.    Hypertension  Yes    Intervention  Provide education on lifestyle modifcations including regular physical activity/exercise, weight management, moderate sodium restriction and increased consumption of fresh fruit, vegetables, and low fat dairy, alcohol moderation, and smoking cessation.;Monitor prescription use  compliance.    Expected Outcomes  Short Term: Continued assessment and intervention until BP is < 140/18mm HG in hypertensive participants. < 130/41mm HG in hypertensive participants with diabetes, heart failure or chronic kidney disease.;Long Term: Maintenance of blood pressure at goal levels.    Lipids  Yes    Intervention  Provide education and support for participant on nutrition & aerobic/resistive exercise along with prescribed medications to achieve LDL 70mg , HDL >40mg .    Expected Outcomes  Short Term: Participant states understanding of desired cholesterol values and is compliant with medications prescribed. Participant is following exercise prescription and nutrition guidelines.;Long Term: Cholesterol controlled with medications as prescribed, with individualized exercise RX and with personalized nutrition plan. Value goals: LDL < 70mg , HDL > 40 mg.    Stress  Yes  Intervention  Offer individual and/or small group education and counseling on adjustment to heart disease, stress management and health-related lifestyle change. Teach and support self-help strategies.;Refer participants experiencing significant psychosocial distress to appropriate mental health specialists for further evaluation and treatment. When possible, include family members and significant others in education/counseling sessions.    Expected Outcomes  Short Term: Participant demonstrates changes in health-related behavior, relaxation and other stress management skills, ability to obtain effective social support, and compliance with psychotropic medications if prescribed.;Long Term: Emotional wellbeing is indicated by absence of clinically significant psychosocial distress or social isolation.    Personal Goal Other  Yes    Personal Goal  Increase confidence with exercise and physical activity. Learn activity limitations.    Intervention  Provide cardiac education class on how to exercise on your own. Offer individual and  group couseling on safety precautions with physical activity. Provide safe and effective exercise programming to increase confidence and aerobic fitness    Expected Outcomes  Pt will be able to exercise safely in the community. Pt will increase confidence level with exercise and learn activity limitations.       Core Components/Risk Factors/Patient Goals Review:    Core Components/Risk Factors/Patient Goals at Discharge (Final Review):    ITP Comments: ITP Comments    Row Name 02/23/18 1142           ITP Comments  Dr. Fransico Him , Medical Director           Comments: Swathi attended orientation from [phone] to [phone] to review rules and guidelines for program. Completed 6 minute walk test, Intitial ITP, and exercise prescription.  VSS. Telemetry-Sinus Rhythm . Vonzella did experience a twinge in her left chest area towards the end of her walk test. Estill Bamberg denied having any chest pain or discomfort. Bryelle has a history of depression and see's a therapist on a regular basis. Will review quality of life questionnaire with patient to review and discuss scores during exercise.Barnet Pall, RN,BSN 02/27/2018 11:50 AM

## 2018-02-28 ENCOUNTER — Telehealth (HOSPITAL_COMMUNITY): Payer: Self-pay | Admitting: *Deleted

## 2018-02-28 NOTE — Telephone Encounter (Signed)
-----   Message from Jerline Pain, MD sent at 02/28/2018 11:10 AM EDT ----- Regarding: RE: c/o twinges to the Left chest Continue to encourage exercise. Atypical CP Candee Furbish, MD  ----- Message ----- From: Rowe Pavy, RN Sent: 02/27/2018   9:12 AM To: Jerline Pain, MD Subject: c/o twinges to the Left chest                  Dr. Marlou Porch,  The above pt s/p DES to [facility] and [facility] on 12/20/17 in this morning for her cardiac rehab orientation. Pt completed her follow up with Bghat PA on 5/17 in the office and will see you in August.  Pt completed 6 minute walk test and complained of some "twinges" to her left chest area during the walk test that occurred momentarily and resolved on its on. This was reported to rehab staff at the completion of her walk test.  Pt reports having these momentary twinges during heavy activity or while walking on her treadmill 3.0/0.0.  This twinges do not occur each time she is involved in activity and is unlike the discomfort that brought her to the hospital. Pt vital signs remained stable with no changes in EKG pattern. 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

## 2018-03-06 ENCOUNTER — Ambulatory Visit (HOSPITAL_COMMUNITY): Payer: BLUE CROSS/BLUE SHIELD

## 2018-03-12 ENCOUNTER — Encounter (HOSPITAL_COMMUNITY)
Admission: RE | Admit: 2018-03-12 | Discharge: 2018-03-12 | Disposition: A | Discharge status: Home / Self Care | Payer: BLUE CROSS/BLUE SHIELD | Source: Ambulatory Visit | Attending: Cardiology | Admitting: Cardiology

## 2018-03-12 ENCOUNTER — Encounter (HOSPITAL_COMMUNITY): Payer: BLUE CROSS/BLUE SHIELD

## 2018-03-12 ENCOUNTER — Encounter (HOSPITAL_COMMUNITY): Payer: Self-pay

## 2018-03-12 DIAGNOSIS — Z955 Presence of coronary angioplasty implant and graft: Secondary | ICD-10-CM | POA: Diagnosis not present

## 2018-03-12 NOTE — Progress Notes (Signed)
Daily Session Note  Patient Details  Name: Summer Hodge MRN: [id number] Date of Birth: 1955/09/21 Referring Provider:     CARDIAC REHAB PHASE II ORIENTATION from 02/27/2018 in [facility]  Referring Provider  Candee Furbish MD      Encounter Date: 03/12/2018  Check In: Session Check In - 03/12/18 0714      Check-In   Location  [facility]    Staff Present  Su Hilt, MS, ACSM RCEP, Exercise Physiologist;Joann Rion, RN, BSN;Tyara Nevels, MS,ACSM CEP, Exercise Physiologist;Munirah Doerner Karle Starch, RN, BSN    Supervising physician immediately available to respond to emergencies  [facility] immediately available    Physician(s)  Dr. Denton Brick    Medication changes reported      No    Fall or balance concerns reported     No    Tobacco Cessation  No Change    Warm-up and Cool-down  Performed as group-led instruction    Resistance Training Performed  Yes    VAD Patient?  No      Pain Assessment   Currently in Pain?  No/denies    Multiple Pain Sites  No       Capillary Blood Glucose: No results found for this or any previous visit (from the past 24 hour(s)).    Social History   Tobacco Use  Smoking Status Former Smoker  . Types: Cigarettes  . Last attempt to quit: 06/23/1975  . Years since quitting: 42.7  Smokeless Tobacco Never Used  Tobacco Comment   SMOKED BRIEFLY IN EARLY 20'S    Goals Met:  Exercise tolerated well  Goals Unmet:  Not Applicable  Comments: Pt started cardiac rehab today.  Pt tolerated light exercise without difficulty. VSS, telemetry-SR, asymptomatic.  Medication list reconciled. Pt denies barriers to medicaiton compliance.  PSYCHOSOCIAL ASSESSMENT:  PHQ-1. Pt exhibits positive coping skills, hopeful outlook with supportive family. No psychosocial needs identified at this time, no psychosocial interventions necessary.  Pt oriented to exercise equipment and routine.    Understanding verbalized.    Dr.  Fransico Him is Medical Director for Cardiac Rehab at [facility].

## 2018-03-13 ENCOUNTER — Ambulatory Visit (HOSPITAL_COMMUNITY): Payer: BLUE CROSS/BLUE SHIELD

## 2018-03-14 ENCOUNTER — Encounter (HOSPITAL_COMMUNITY): Payer: BLUE CROSS/BLUE SHIELD

## 2018-03-14 ENCOUNTER — Encounter (HOSPITAL_COMMUNITY)
Admission: RE | Admit: 2018-03-14 | Discharge: 2018-03-14 | Disposition: A | Discharge status: Home / Self Care | Payer: BLUE CROSS/BLUE SHIELD | Source: Ambulatory Visit | Attending: Cardiology | Admitting: Cardiology

## 2018-03-14 DIAGNOSIS — Z955 Presence of coronary angioplasty implant and graft: Secondary | ICD-10-CM

## 2018-03-14 NOTE — Progress Notes (Signed)
Summer Hodge 62 y.o. female Nutrition Note Spoke with pt. Nutrition Plan and Nutrition Survey goals reviewed with pt. Pt is following a Heart Healthy diet. Pt wants to lose wt. Pt has been trying to lose wt by  decreasing amount of carbs eaten, and by engaging in regular physical activity. Discussed additional weight loss tips. Pt reports her last A1C was 6.1, reminded patient to bring in a copy of her last A1c to confirm. Discussed following a Consistent Carbohydrate Heart Healthy diet with patient to help manage prediabetes. Per discussion, pt does not use canned/convenience foods often. Pt eats out frequently, 2-3 times a week. Consumes dessert items/baked goods 2-3x a week, more often when she is stressed. Discussed mindful eating to increase awareness around emotional eating. Discussed quick and easy recipes to help decrease meals eaten away from home. Discussed portion sizes with patient and distributed handouts. Pt expressed understanding of the information reviewed. Pt aware of nutrition education classes offered and plans on attending nutrition classes    No results found for: HGBA1C  Wt Readings from Last 3 Encounters:  02/27/18 179 lb 7.3 oz (81.4 kg)  01/05/18 182 lb (82.6 kg)  12/21/17 185 lb 3 oz (84 kg)    Nutrition Diagnosis ? Food-and nutrition-related knowledge deficit related to lack of exposure to information as related to diagnosis of: ? CVD  ? Obesity related to excessive energy intake as evidenced by a BMI of 33.91   Nutrition Intervention ? Pt's individual nutrition plan reviewed with pt. ? Benefits of adopting Heart Healthy diet discussed when Med[employer]s reviewed.   ? Pt given handouts for: ? Nutrition I class ? Nutrition II class  ? Continue client-centered nutrition education by RD, as part of interdisciplinary care.  Goal(s)  ? Pt to identify and limit food sources of saturated fat, trans fat, and sodium ? Pt to identify food quantities necessary to achieve  weight loss of 6-20 lb at graduation from cardiac rehab.    Plan:  Pt to attend nutrition classes ? Nutrition I ? Nutrition II ? Portion Distortion  Will provide client-centered nutrition education as part of interdisciplinary care. Monitor and evaluate progress toward nutrition goal with team.   Laurina Bustle, MS, RD, LDN 03/14/2018 8:10 AM

## 2018-03-16 ENCOUNTER — Encounter (HOSPITAL_COMMUNITY): Payer: BLUE CROSS/BLUE SHIELD

## 2018-03-16 ENCOUNTER — Encounter (HOSPITAL_COMMUNITY)
Admission: RE | Admit: 2018-03-16 | Discharge: 2018-03-16 | Disposition: A | Discharge status: Home / Self Care | Payer: BLUE CROSS/BLUE SHIELD | Source: Ambulatory Visit | Attending: Cardiology | Admitting: Cardiology

## 2018-03-16 DIAGNOSIS — Z955 Presence of coronary angioplasty implant and graft: Secondary | ICD-10-CM | POA: Diagnosis not present

## 2018-03-19 ENCOUNTER — Encounter (HOSPITAL_COMMUNITY)
Admission: RE | Admit: 2018-03-19 | Discharge: 2018-03-19 | Disposition: A | Discharge status: Home / Self Care | Payer: BLUE CROSS/BLUE SHIELD | Source: Ambulatory Visit | Attending: Cardiology | Admitting: Cardiology

## 2018-03-19 ENCOUNTER — Encounter (HOSPITAL_COMMUNITY): Payer: BLUE CROSS/BLUE SHIELD

## 2018-03-19 DIAGNOSIS — Z955 Presence of coronary angioplasty implant and graft: Secondary | ICD-10-CM | POA: Diagnosis not present

## 2018-03-21 ENCOUNTER — Encounter (HOSPITAL_COMMUNITY)
Admission: RE | Admit: 2018-03-21 | Discharge: 2018-03-21 | Disposition: A | Discharge status: Home / Self Care | Payer: BLUE CROSS/BLUE SHIELD | Source: Ambulatory Visit | Attending: Cardiology | Admitting: Cardiology

## 2018-03-21 ENCOUNTER — Encounter (HOSPITAL_COMMUNITY): Payer: BLUE CROSS/BLUE SHIELD

## 2018-03-21 DIAGNOSIS — F341 Dysthymic disorder: Secondary | ICD-10-CM | POA: Diagnosis not present

## 2018-03-21 DIAGNOSIS — Z955 Presence of coronary angioplasty implant and graft: Secondary | ICD-10-CM | POA: Diagnosis not present

## 2018-03-21 DIAGNOSIS — F4010 Social phobia, unspecified: Secondary | ICD-10-CM | POA: Diagnosis not present

## 2018-03-21 NOTE — Progress Notes (Signed)
Reviewed home exercise guidelines with patient including endpoints, temperature precautions, target heart rate and rate of perceived exertion. Pt plans to walk on treadmill at home as her mode of home exercise. Pt voices understanding of instructions given. Sol Passer, MS, ACSM CEP

## 2018-03-23 ENCOUNTER — Encounter (HOSPITAL_COMMUNITY): Payer: BLUE CROSS/BLUE SHIELD

## 2018-03-23 ENCOUNTER — Encounter (HOSPITAL_COMMUNITY)
Admission: RE | Admit: 2018-03-23 | Discharge: 2018-03-23 | Disposition: A | Discharge status: Home / Self Care | Payer: BLUE CROSS/BLUE SHIELD | Source: Ambulatory Visit | Attending: Cardiology | Admitting: Cardiology

## 2018-03-23 DIAGNOSIS — Z955 Presence of coronary angioplasty implant and graft: Secondary | ICD-10-CM | POA: Diagnosis not present

## 2018-03-26 ENCOUNTER — Encounter (HOSPITAL_COMMUNITY): Payer: BLUE CROSS/BLUE SHIELD

## 2018-03-26 ENCOUNTER — Encounter (HOSPITAL_COMMUNITY): Payer: Self-pay

## 2018-03-26 ENCOUNTER — Encounter (HOSPITAL_COMMUNITY)
Admission: RE | Admit: 2018-03-26 | Discharge: 2018-03-26 | Disposition: A | Discharge status: Home / Self Care | Payer: BLUE CROSS/BLUE SHIELD | Source: Ambulatory Visit | Attending: Cardiology | Admitting: Cardiology

## 2018-03-26 DIAGNOSIS — Z955 Presence of coronary angioplasty implant and graft: Secondary | ICD-10-CM | POA: Diagnosis not present

## 2018-03-27 DIAGNOSIS — E119 Type 2 diabetes mellitus without complications: Secondary | ICD-10-CM | POA: Diagnosis not present

## 2018-03-27 DIAGNOSIS — E785 Hyperlipidemia, unspecified: Secondary | ICD-10-CM | POA: Diagnosis not present

## 2018-03-28 ENCOUNTER — Encounter (HOSPITAL_COMMUNITY)
Admission: RE | Admit: 2018-03-28 | Discharge: 2018-03-28 | Disposition: A | Discharge status: Home / Self Care | Payer: BLUE CROSS/BLUE SHIELD | Source: Ambulatory Visit | Attending: Cardiology | Admitting: Cardiology

## 2018-03-28 ENCOUNTER — Encounter (HOSPITAL_COMMUNITY): Payer: BLUE CROSS/BLUE SHIELD

## 2018-03-28 DIAGNOSIS — Z955 Presence of coronary angioplasty implant and graft: Secondary | ICD-10-CM

## 2018-03-29 DIAGNOSIS — E1165 Type 2 diabetes mellitus with hyperglycemia: Secondary | ICD-10-CM | POA: Diagnosis not present

## 2018-03-29 DIAGNOSIS — I10 Essential (primary) hypertension: Secondary | ICD-10-CM | POA: Diagnosis not present

## 2018-03-29 DIAGNOSIS — E785 Hyperlipidemia, unspecified: Secondary | ICD-10-CM | POA: Diagnosis not present

## 2018-03-29 NOTE — Progress Notes (Signed)
Cardiac Individual Treatment Plan  Patient Details  Name: Summer Hodge MRN: [id number] Date of Birth: 1955/10/09 Referring Provider:     CARDIAC REHAB PHASE II ORIENTATION from 02/27/2018 in [place]  Referring Provider  Candee Furbish MD      Initial Encounter Date:    CARDIAC REHAB PHASE II ORIENTATION from 02/27/2018 in [facility]  Date  02/27/18      Visit Diagnosis: Stented coronary artery 12/20/17 DES RCA ,LAD  Patient's Home Medications on Admission:  Current Outpatient Medications:  .  aspirin EC 81 MG tablet, Take 81 mg by mouth at bedtime., Disp: , Rfl:  .  carvedilol (COREG) 3.125 MG tablet, Take 1 tablet (3.125 mg total) by mouth 2 (two) times daily., Disp: 180 tablet, Rfl: 3 .  ezetimibe (ZETIA) 10 MG tablet, Take 10 mg by mouth daily. , Disp: , Rfl: 1 .  lisinopril (PRINIVIL,ZESTRIL) 10 MG tablet, Take 10 mg by mouth daily., Disp: , Rfl:  .  nitroGLYCERIN (NITROSTAT) 0.4 MG SL tablet, Place 1 tablet (0.4 mg total) under the tongue every 5 (five) minutes as needed for chest pain., Disp: 25 tablet, Rfl: 4 .  Polyethyl Glycol-Propyl Glycol (SYSTANE) 0.4-0.3 % SOLN, Apply 1 drop to eye 2 (two) times a week., Disp: , Rfl:  .  rosuvastatin (CRESTOR) 10 MG tablet, Take 1 tablet three times weekly and increase as tolerated., Disp: 30 tablet, Rfl: 11 .  ticagrelor (BRILINTA) 90 MG TABS tablet, Take 1 tablet (90 mg total) by mouth 2 (two) times daily., Disp: 60 tablet, Rfl: 11  Past Medical History: Past Medical History:  Diagnosis Date  . Allergic rhinitis   . Anemia   . Anginal pain ([place])   . Arthritis   . Coronary artery disease    12/20/17- DES to mid RCA & LAD. other non-obstructive disease.   . Crohn disease ([place])   . Depression   . Diabetes mellitus without complication ([place])   . Family history of heart disease   . GERD (gastroesophageal reflux disease)   . Hyperlipidemia   . Hypertension   .  Osteopenia   . Sleep apnea   . Vitamin D deficiency     Tobacco Use: Social History   Tobacco Use  Smoking Status Former Smoker  . Types: Cigarettes  . Last attempt to quit: 06/23/1975  . Years since quitting: 42.7  Smokeless Tobacco Never Used  Tobacco Comment   SMOKED BRIEFLY IN EARLY 20'S    Labs: Recent Review Flowsheet Data    Labs for ITP Cardiac and Pulmonary Rehab Latest Ref Rng & Units 01/04/2007 07/23/2007 07/15/2008 05/17/2013   Cholestrol 0 - 200 mg/dL 171 179 167 -   LDLCALC 0 - 99 mg/dL 95 103(H) 93 -   HDL >39.0 mg/dL 43.1 44.7 39.2 -   Trlycerides 0 - 149 mg/dL 164(H) 157(H) 174(H) -   TCO2 0 - 100 mmol/L - - - 26      Capillary Blood Glucose: Lab Results  Component Value Date   GLUCAP 119 (H) 12/21/2017   GLUCAP 95 12/20/2017   GLUCAP 112 (H) 12/20/2017   GLUCAP 103 (H) 12/20/2017     Exercise Target Goals:    Exercise Program Goal: Individual exercise prescription set using results from initial 6 min walk test and THRR while considering  patient's activity barriers and safety.   Exercise Prescription Goal: Initial exercise prescription builds to 30-45 minutes a day of aerobic activity, 2-3  days per week.  Home exercise guidelines will be given to patient during program as part of exercise prescription that the participant will acknowledge.  Activity Barriers & Risk Stratification: Activity Barriers & Cardiac Risk Stratification - 02/27/18 1029      Activity Barriers & Cardiac Risk Stratification   Activity Barriers  Deconditioning;Muscular Weakness;Joint Problems;Other (comment)    Comments  myalgia and R knee discomfort    Cardiac Risk Stratification  Moderate       6 Minute Walk: 6 Minute Walk    Row Name 02/27/18 1009 02/27/18 1108       6 Minute Walk   Phase  Initial  -    Distance  1675 feet  -    Walk Time  6 minutes  -    # of Rest Breaks  0  -    MPH  3.2  -    METS  3.4  -    RPE  12  -    VO2 Peak  12.1  -    Symptoms   Yes (comment)  -    Comments  twinge in chest region, L-sided  -    Resting HR  68 bpm  -    Resting BP  128/68  -    Resting Oxygen Saturation   98 %  -    Exercise Oxygen Saturation  during 6 min walk  98 %  -    Max Ex. HR  93 bpm  -    Max Ex. BP  140/80  -    2 Minute Post BP  -  122/68       Oxygen Initial Assessment:   Oxygen Re-Evaluation:   Oxygen Discharge (Final Oxygen Re-Evaluation):   Initial Exercise Prescription: Initial Exercise Prescription - 02/27/18 1000      Date of Initial Exercise RX and Referring Provider   Date  02/27/18    Referring Provider  Candee Furbish MD    Expected Discharge Date  06/04/18      Treadmill   MPH  3    Grade  0    Minutes  10    METs  3.3      Bike   Level  0.7    Minutes  10    METs  2.65      NuStep   Level  3    SPM  80    Minutes  10    METs  2.5      Prescription Details   Frequency (times per week)  3    Duration  Progress to 30 minutes of continuous aerobic without signs/symptoms of physical distress      Intensity   THRR 40-80% of Max Heartrate  62-125    Ratings of Perceived Exertion  11-13    Perceived Dyspnea  0-4      Progression   Progression  Continue to progress workloads to maintain intensity without signs/symptoms of physical distress.      Resistance Training   Training Prescription  Yes    Weight  3lbs    Reps  10-15       Perform Capillary Blood Glucose checks as needed.  Exercise Prescription Changes: Exercise Prescription Changes    Row Name 03/12/18 1000 03/26/18 0805           Response to Exercise   Blood Pressure (Admit)  160/80 Recheck: 148/86  132/72 Recheck: 148/86      Blood Pressure (Exercise)  148/92  142/80      Blood Pressure (Exit)  130/82  130/72      Heart Rate (Admit)  73 bpm  69 bpm      Heart Rate (Exercise)  102 bpm  120 bpm      Heart Rate (Exit)  72 bpm  73 bpm      Rating of Perceived Exertion (Exercise)  12  13      Perceived Dyspnea (Exercise)  0   0      Symptoms  Elevated BP   None       Comments  Pt oriented to exercise equipment  -      Duration  Progress to 30 minutes of  aerobic without signs/symptoms of physical distress  Continue with 30 min of aerobic exercise without signs/symptoms of physical distress.      Intensity  THRR New  THRR unchanged        Progression   Progression  Continue to progress workloads to maintain intensity without signs/symptoms of physical distress.  Continue to progress workloads to maintain intensity without signs/symptoms of physical distress.      Average METs  2.63  2.9        Resistance Training   Training Prescription  Yes  Yes      Weight  3lbs  3lbs      Reps  10-15  10-15      Time  10 Minutes  10 Minutes        Interval Training   Interval Training  No  No        Treadmill   MPH  3  3      Grade  0  0      Minutes  10  10      METs  3.3  3.3        Bike   Level  0.7  1.7      Minutes  10  10      METs  2.62  2.61        NuStep   Level  3  4      SPM  80  105      Minutes  10  10      METs  1.98  2.8        Home Exercise Plan   Plans to continue exercise at  -  Home (comment) Treadmill @ Home       Frequency  -  Add 2 additional days to program exercise sessions.      Initial Home Exercises Provided  -  03/21/18         Exercise Comments: Exercise Comments    Row Name 03/12/18 1034 03/21/18 0715         Exercise Comments  Pt 's first day of exericse. Pt was oriented to exercise equipment. Pt responded well to exericse prescription. Will continue to monitor and progress pt.   Reviewed home exercise guidelines with patient.         Exercise Goals and Review: Exercise Goals    Row Name 02/27/18 0816             Exercise Goals   Increase Physical Activity  Yes       Intervention  Provide advice, education, support and counseling about physical activity/exercise needs.;Develop an individualized exercise prescription for aerobic and resistive training based on  initial evaluation findings, risk stratification, comorbidities and participant's personal goals.  Expected Outcomes  Short Term: Attend rehab on a regular basis to increase amount of physical activity.;Long Term: Add in home exercise to make exercise part of routine and to increase amount of physical activity.;Long Term: Exercising regularly at least 3-5 days a week.       Increase Strength and Stamina  Yes       Intervention  Provide advice, education, support and counseling about physical activity/exercise needs.;Develop an individualized exercise prescription for aerobic and resistive training based on initial evaluation findings, risk stratification, comorbidities and participant's personal goals.       Expected Outcomes  Short Term: Increase workloads from initial exercise prescription for resistance, speed, and METs.;Short Term: Perform resistance training exercises routinely during rehab and add in resistance training at home;Long Term: Improve cardiorespiratory fitness, muscular endurance and strength as measured by increased METs and functional capacity (6MWT)       Able to understand and use rate of perceived exertion (RPE) scale  Yes       Intervention  Provide education and explanation on how to use RPE scale       Expected Outcomes  Short Term: Able to use RPE daily in rehab to express subjective intensity level;Long Term:  Able to use RPE to guide intensity level when exercising independently       Knowledge and understanding of Target Heart Rate Range (THRR)  Yes       Intervention  Provide education and explanation of THRR including how the numbers were predicted and where they are located for reference       Expected Outcomes  Short Term: Able to state/look up THRR;Short Term: Able to use daily as guideline for intensity in rehab;Long Term: Able to use THRR to govern intensity when exercising independently       Able to check pulse independently  Yes       Intervention  Provide  education and demonstration on how to check pulse in carotid and radial arteries.;Review the importance of being able to check your own pulse for safety during independent exercise       Expected Outcomes  Short Term: Able to explain why pulse checking is important during independent exercise;Long Term: Able to check pulse independently and accurately       Understanding of Exercise Prescription  Yes       Intervention  Provide education, explanation, and written materials on patient's individual exercise prescription       Expected Outcomes  Short Term: Able to explain program exercise prescription;Long Term: Able to explain home exercise prescription to exercise independently          Exercise Goals Re-Evaluation : Exercise Goals Re-Evaluation    Row Name 03/21/18 0715             Exercise Goal Re-Evaluation   Exercise Goals Review  Understanding of Exercise Prescription;Increase Physical Activity;Knowledge and understanding of Target Heart Rate Range (THRR);Able to understand and use rate of perceived exertion (RPE) scale       Comments  Reviewed home exercise guidelines with patient including THRR, RPE scale and endpoints for exercise. Pt has a treadmill at home, which she plans to use as her mode of home exercise.       Expected Outcomes  Pt will walk on home treadmill 30-40 minutes, at least 2 days/week in addition to exercise at cardiac rehab.           Discharge Exercise Prescription (Final Exercise Prescription Changes): Exercise Prescription Changes - 03/26/18  0805      Response to Exercise   Blood Pressure (Admit)  132/72    Blood Pressure (Exercise)  142/80    Blood Pressure (Exit)  130/72    Heart Rate (Admit)  69 bpm    Heart Rate (Exercise)  120 bpm    Heart Rate (Exit)  73 bpm    Rating of Perceived Exertion (Exercise)  13    Perceived Dyspnea (Exercise)  0    Symptoms  None     Duration  Continue with 30 min of aerobic exercise without signs/symptoms of physical  distress.    Intensity  THRR unchanged      Progression   Progression  Continue to progress workloads to maintain intensity without signs/symptoms of physical distress.    Average METs  2.9      Resistance Training   Training Prescription  Yes    Weight  3lbs    Reps  10-15    Time  10 Minutes      Interval Training   Interval Training  No      Treadmill   MPH  3    Grade  0    Minutes  10    METs  3.3      Bike   Level  1.7    Minutes  10    METs  2.61      NuStep   Level  4    SPM  105    Minutes  10    METs  2.8      Home Exercise Plan   Plans to continue exercise at  Home (comment)    Frequency  Add 2 additional days to program exercise sessions.    Initial Home Exercises Provided  03/21/18       Nutrition:  Target Goals: Understanding of nutrition guidelines, daily intake of sodium 1500mg , cholesterol 200mg , calories 30% from fat and 7% or less from saturated fats, daily to have 5 or more servings of fruits and vegetables.  Biometrics: Pre Biometrics - 02/27/18 1026      Pre Biometrics   Height  5\' 1"  (1.549 m)    Weight  81.4 kg    Waist Circumference  40.5 inches    Hip Circumference  46 inches    Waist to Hip Ratio  0.88 %    BMI (Calculated)  33.93    Triceps Skinfold  33 mm    % Body Fat  45.5 %    Grip Strength  27 kg    Flexibility  15 in    Single Leg Stand  30 seconds        Nutrition Therapy Plan and Nutrition Goals: Nutrition Therapy & Goals - 03/14/18 0810      Nutrition Therapy   Diet  consistent carbohydrate heart healthy      Personal Nutrition Goals   Nutrition Goal  Pt to identify food quantities necessary to achieve weight loss of 6-24 lbs. at graduation from cardiac rehab. Goal wt of 20 lb desired.     Personal Goal #2  Pt to identify and limit food sources of saturated fat, trans fat, and sodium      Intervention Plan   Intervention  Prescribe, educate and counsel regarding individualized specific dietary  modifications aiming towards targeted core components such as weight, hypertension, lipid management, diabetes, heart failure and other comorbidities.    Expected Outcomes  Short Term Goal: Understand basic principles of dietary content, such as calories,  fat, sodium, cholesterol and nutrients.       Nutrition Assessments: Nutrition Assessments - 02/27/18 0858      MEDFICTS Scores   Pre Score  47       Nutrition Goals Re-Evaluation:   Nutrition Goals Re-Evaluation:   Nutrition Goals Discharge (Final Nutrition Goals Re-Evaluation):   Psychosocial: Target Goals: Acknowledge presence or absence of significant depression and/or stress, maximize coping skills, provide positive support system. Participant is able to verbalize types and ability to use techniques and skills needed for reducing stress and depression.  Initial Review & Psychosocial Screening: Initial Psych Review & Screening - 02/27/18 1121      Initial Review   Current issues with  History of Depression      Family Dynamics   Good Support System?  Yes    Comments  Jermia recently lost her dog of 15 years      Barriers   Psychosocial barriers to participate in program  The patient should benefit from training in stress management and relaxation.;Psychosocial barriers identified (see note)      Screening Interventions   Interventions  Encouraged to exercise;To provide support and resources with identified psychosocial needs;Provide feedback about the scores to participant    Expected Outcomes  Short Term goal: Utilizing psychosocial counselor, staff and physician to assist with identification of specific Stressors or current issues interfering with healing process. Setting desired goal for each stressor or current issue identified.;Long Term Goal: Stressors or current issues are controlled or eliminated.;Short Term goal: Identification and review with participant of any Quality of Life or Depression concerns found by  scoring the questionnaire.;Long Term goal: The participant improves quality of Life and PHQ9 Scores as seen by post scores and/or verbalization of changes       Quality of Life Scores: Quality of Life - 02/27/18 1032      Quality of Life   Select  Quality of Life      Quality of Life Scores   Health/Function Pre  14.75 %    Socioeconomic Pre  17.43 %    Psych/Spiritual Pre  10.5 %    Family Pre  12.13 %    GLOBAL Pre  14.19 %      Scores of 19 and below usually indicate a poorer quality of life in these areas.  A difference of  2-3 points is a clinically meaningful difference.  A difference of 2-3 points in the total score of the Quality of Life Index has been associated with significant improvement in overall quality of life, self-image, physical symptoms, and general health in studies assessing change in quality of life.  PHQ-9: Recent Review Flowsheet Data    Depression screen [facility] 2/9 03/12/2018   Decreased Interest 0   Down, Depressed, Hopeless 1   PHQ - 2 Score 1     Interpretation of Total Score  Total Score Depression Severity:  1-4 = Minimal depression, 5-9 = Mild depression, 10-14 = Moderate depression, 15-19 = Moderately severe depression, 20-27 = Severe depression   Psychosocial Evaluation and Intervention: Psychosocial Evaluation - 03/12/18 0924      Psychosocial Evaluation & Interventions   Interventions  Encouraged to exercise with the program and follow exercise prescription;Stress management education;Relaxation education    Comments  Zahira has a history of depression.  Some days she has "episodes" of depression.  Does not currently have a therapist or have a desire to see representative from [place].   Fanchon enjoys reading, walking, and watching  TV.     Expected Outcomes  Tamora will endorse a positive outlook with less reported days of depression.     Continue Psychosocial Services   Follow up required by staff       Psychosocial  Re-Evaluation: Psychosocial Re-Evaluation    [place] Name 03/26/18 1653             Psychosocial Re-Evaluation   Current issues with  History of Depression       Comments  No psychosocial needs identified at this time.  Aryanne declined meeting with the spiritual care department.       Expected Outcomes  Tyeisha will continue to maintain a positive outlook with good coping skills.       Interventions  Encouraged to attend Cardiac Rehabilitation for the exercise;Relaxation education       Continue Psychosocial Services   Follow up required by staff          Psychosocial Discharge (Final Psychosocial Re-Evaluation): Psychosocial Re-Evaluation - 03/26/18 1653      Psychosocial Re-Evaluation   Current issues with  History of Depression    Comments  No psychosocial needs identified at this time.  Valerye declined meeting with the spiritual care department.    Expected Outcomes  Nadine will continue to maintain a positive outlook with good coping skills.    Interventions  Encouraged to attend Cardiac Rehabilitation for the exercise;Relaxation education    Continue Psychosocial Services   Follow up required by staff       Vocational Rehabilitation: Provide vocational rehab assistance to qualifying candidates.   Vocational Rehab Evaluation & Intervention: Vocational Rehab - 02/27/18 1127      Initial Vocational Rehab Evaluation & Intervention   Assessment shows need for Vocational Rehabilitation  No       Education: Education Goals: Education classes will be provided on a weekly basis, covering required topics. Participant will state understanding/return demonstration of topics presented.  Learning Barriers/Preferences: Learning Barriers/Preferences - 02/27/18 0816      Learning Barriers/Preferences   Learning Barriers  Sight    Learning Preferences  Audio;Video;Verbal Instruction       Education Topics: Count Your Pulse:  -Group instruction provided by verbal instruction,  demonstration, patient participation and written materials to support subject.  Instructors address importance of being able to find your pulse and how to count your pulse when at home without a heart monitor.  Patients get hands on experience counting their pulse with staff help and individually.   Heart Attack, Angina, and Risk Factor Modification:  -Group instruction provided by verbal instruction, video, and written materials to support subject.  Instructors address signs and symptoms of angina and heart attacks.    Also discuss risk factors for heart disease and how to make changes to improve heart health risk factors.   Functional Fitness:  -Group instruction provided by verbal instruction, demonstration, patient participation, and written materials to support subject.  Instructors address safety measures for doing things around the house.  Discuss how to get up and down off the floor, how to pick things up properly, how to safely get out of a chair without assistance, and balance training.   Meditation and Mindfulness:  -Group instruction provided by verbal instruction, patient participation, and written materials to support subject.  Instructor addresses importance of mindfulness and meditation practice to help reduce stress and improve awareness.  Instructor also leads participants through a meditation exercise.    Stretching for Flexibility and Mobility:  -Group instruction provided by  verbal instruction, patient participation, and written materials to support subject.  Instructors lead participants through series of stretches that are designed to increase flexibility thus improving mobility.  These stretches are additional exercise for major muscle groups that are typically performed during regular warm up and cool down.   Hands Only CPR:  -Group verbal, video, and participation provides a basic overview of AHA guidelines for community CPR. Role-play of emergencies allow participants  the opportunity to practice calling for help and chest compression technique with discussion of AED use.   Hypertension: -Group verbal and written instruction that provides a basic overview of hypertension including the most recent diagnostic guidelines, risk factor reduction with self-care instructions and medication management.    Nutrition I class: Heart Healthy Eating:  -Group instruction provided by PowerPoint slides, verbal discussion, and written materials to support subject matter. The instructor gives an explanation and review of the Therapeutic Lifestyle Changes diet recommendations, which includes a discussion on lipid goals, dietary fat, sodium, fiber, plant stanol/sterol esters, sugar, and the components of a well-balanced, healthy diet.   Nutrition II class: Lifestyle Skills:  -Group instruction provided by PowerPoint slides, verbal discussion, and written materials to support subject matter. The instructor gives an explanation and review of label reading, grocery shopping for heart health, heart healthy recipe modifications, and ways to make healthier choices when eating out.   Diabetes Question & Answer:  -Group instruction provided by PowerPoint slides, verbal discussion, and written materials to support subject matter. The instructor gives an explanation and review of diabetes co-morbidities, pre- and post-prandial blood glucose goals, pre-exercise blood glucose goals, signs, symptoms, and treatment of hypoglycemia and hyperglycemia, and foot care basics.   Diabetes Blitz:  -Group instruction provided by PowerPoint slides, verbal discussion, and written materials to support subject matter. The instructor gives an explanation and review of the physiology behind type 1 and type 2 diabetes, diabetes medications and rational behind using different medications, pre- and post-prandial blood glucose recommendations and Hemoglobin A1c goals, diabetes diet, and exercise including blood  glucose guidelines for exercising safely.    Portion Distortion:  -Group instruction provided by PowerPoint slides, verbal discussion, written materials, and food models to support subject matter. The instructor gives an explanation of serving size versus portion size, changes in portions sizes over the last 20 years, and what consists of a serving from each food group.   Stress Management:  -Group instruction provided by verbal instruction, video, and written materials to support subject matter.  Instructors review role of stress in heart disease and how to cope with stress positively.     CARDIAC REHAB PHASE II EXERCISE from 03/21/2018 in [place]  Date  03/14/18  Educator  RN  Instruction Review Code  2- Demonstrated Understanding      Exercising on Your Own:  -Group instruction provided by verbal instruction, power point, and written materials to support subject.  Instructors discuss benefits of exercise, components of exercise, frequency and intensity of exercise, and end points for exercise.  Also discuss use of nitroglycerin and activating EMS.  Review options of places to exercise outside of rehab.  Review guidelines for sex with heart disease.   Cardiac Drugs I:  -Group instruction provided by verbal instruction and written materials to support subject.  Instructor reviews cardiac drug classes: antiplatelets, anticoagulants, beta blockers, and statins.  Instructor discusses reasons, side effects, and lifestyle considerations for each drug class.   Cardiac Drugs II:  -Group instruction provided by  verbal instruction and written materials to support subject.  Instructor reviews cardiac drug classes: angiotensin converting enzyme inhibitors (ACE-I), angiotensin II receptor blockers (ARBs), nitrates, and calcium channel blockers.  Instructor discusses reasons, side effects, and lifestyle considerations for each drug class.   Anatomy and Physiology of  the Circulatory System:  Group verbal and written instruction and models provide basic cardiac anatomy and physiology, with the coronary electrical and arterial systems. Review of: AMI, Angina, Valve disease, Heart Failure, Peripheral Artery Disease, Cardiac Arrhythmia, Pacemakers, and the ICD.   CARDIAC REHAB PHASE II EXERCISE from 03/21/2018 in [place]  Date  03/21/18  Educator  RN  Instruction Review Code  2- Demonstrated Understanding      Other Education:  -Group or individual verbal, written, or video instructions that support the educational goals of the cardiac rehab program.   Holiday Eating Survival Tips:  -Group instruction provided by PowerPoint slides, verbal discussion, and written materials to support subject matter. The instructor gives patients tips, tricks, and techniques to help them not only survive but enjoy the holidays despite the onslaught of food that accompanies the holidays.   Knowledge Questionnaire Score: Knowledge Questionnaire Score - 02/27/18 1003      Knowledge Questionnaire Score   Pre Score  23/24       Core Components/Risk Factors/Patient Goals at Admission: Personal Goals and Risk Factors at Admission - 02/27/18 1147      Core Components/Risk Factors/Patient Goals on Admission    Weight Management  Yes;Obesity;Weight Maintenance;Weight Loss    Intervention  Weight Management: Develop a combined nutrition and exercise program designed to reach desired caloric intake, while maintaining appropriate intake of nutrient and fiber, sodium and fats, and appropriate energy expenditure required for the weight goal.;Weight Management: Provide education and appropriate resources to help participant work on and attain dietary goals.;Weight Management/Obesity: Establish reasonable short term and long term weight goals.;Obesity: Provide education and appropriate resources to help participant work on and attain dietary goals.     Admit Weight  179 lb 7.3 oz (81.4 kg)    Goal Weight: Short Term  169 lb (76.7 kg)    Goal Weight: Long Term  159 lb (72.1 kg)    Expected Outcomes  Short Term: Continue to assess and modify interventions until short term weight is achieved;Long Term: Adherence to nutrition and physical activity/exercise program aimed toward attainment of established weight goal;Weight Maintenance: Understanding of the daily nutrition guidelines, which includes 25-35% calories from fat, 7% or less cal from saturated fats, less than 200mg  cholesterol, less than 1.5gm of sodium, & 5 or more servings of fruits and vegetables daily;Weight Loss: Understanding of general recommendations for a balanced deficit meal plan, which promotes 1-2 lb weight loss per week and includes a negative energy balance of [phone] kcal/d;Understanding recommendations for meals to include 15-35% energy as protein, 25-35% energy from fat, 35-60% energy from carbohydrates, less than 200mg  of dietary cholesterol, 20-35 gm of total fiber daily;Understanding of distribution of calorie intake throughout the day with the consumption of 4-5 meals/snacks    Diabetes  Yes    Intervention  Provide education about signs/symptoms and action to take for hypo/hyperglycemia.;Provide education about proper nutrition, including hydration, and aerobic/resistive exercise prescription along with prescribed medications to achieve blood glucose in normal ranges: Fasting glucose 65-99 mg/dL    Expected Outcomes  Short Term: Participant verbalizes understanding of the signs/symptoms and immediate care of hyper/hypoglycemia, proper foot care and importance of medication, aerobic/resistive exercise and  nutrition plan for blood glucose control.;Long Term: Attainment of HbA1C < 7%.    Hypertension  Yes    Intervention  Provide education on lifestyle modifcations including regular physical activity/exercise, weight management, moderate sodium restriction and increased consumption  of fresh fruit, vegetables, and low fat dairy, alcohol moderation, and smoking cessation.;Monitor prescription use compliance.    Expected Outcomes  Short Term: Continued assessment and intervention until BP is < 140/94mm HG in hypertensive participants. < 130/17mm HG in hypertensive participants with diabetes, heart failure or chronic kidney disease.;Long Term: Maintenance of blood pressure at goal levels.    Lipids  Yes    Intervention  Provide education and support for participant on nutrition & aerobic/resistive exercise along with prescribed medications to achieve LDL 70mg , HDL >40mg .    Expected Outcomes  Short Term: Participant states understanding of desired cholesterol values and is compliant with medications prescribed. Participant is following exercise prescription and nutrition guidelines.;Long Term: Cholesterol controlled with medications as prescribed, with individualized exercise RX and with personalized nutrition plan. Value goals: LDL < 70mg , HDL > 40 mg.    Stress  Yes    Intervention  Offer individual and/or small group education and counseling on adjustment to heart disease, stress management and health-related lifestyle change. Teach and support self-help strategies.;Refer participants experiencing significant psychosocial distress to appropriate mental health specialists for further evaluation and treatment. When possible, include family members and significant others in education/counseling sessions.    Expected Outcomes  Short Term: Participant demonstrates changes in health-related behavior, relaxation and other stress management skills, ability to obtain effective social support, and compliance with psychotropic medications if prescribed.;Long Term: Emotional wellbeing is indicated by absence of clinically significant psychosocial distress or social isolation.    Personal Goal Other  Yes    Personal Goal  Increase confidence with exercise and physical activity. Learn activity  limitations.    Intervention  Provide cardiac education class on how to exercise on your own. Offer individual and group couseling on safety precautions with physical activity. Provide safe and effective exercise programming to increase confidence and aerobic fitness    Expected Outcomes  Pt will be able to exercise safely in the community. Pt will increase confidence level with exercise and learn activity limitations.       Core Components/Risk Factors/Patient Goals Review:  Goals and Risk Factor Review    Row Name 03/12/18 0920 03/26/18 1652           Core Components/Risk Factors/Patient Goals Review   Personal Goals Review  Weight Management/Obesity;Lipids;Hypertension;Stress;Diabetes  Weight Management/Obesity;Lipids;Hypertension;Stress;Diabetes      Review  Pt with multiple CAD RFs willinng to participate in CR exercise. Vanissa would like to be unafraid of exercise and learn boundaries.   Pt with multiple CAD RFs willinng to participate in CR exercise. Kassadie is increasing her comfort on the various machines and is tolerating exercise well.  She has been able to help her peers set up machines when they are new in the program.       Expected Outcomes  Ellington will participate in CR exercse, nutrition, and lifestyle modification opportunities.   Eletha will participate in CR exercise, nutrition, and lifestyle modification opportunities.          Core Components/Risk Factors/Patient Goals at Discharge (Final Review):  Goals and Risk Factor Review - 03/26/18 1652      Core Components/Risk Factors/Patient Goals Review   Personal Goals Review  Weight Management/Obesity;Lipids;Hypertension;Stress;Diabetes    Review  Pt with multiple CAD RFs  willinng to participate in CR exercise. Reighlynn is increasing her comfort on the various machines and is tolerating exercise well.  She has been able to help her peers set up machines when they are new in the program.     Expected Outcomes  Jamea will  participate in CR exercise, nutrition, and lifestyle modification opportunities.        ITP Comments: ITP Comments    Row Name 02/23/18 1142 03/12/18 0751 03/26/18 0943       ITP Comments  Dr. Fransico Him , Medical Director   Estill Bamberg started exercise today and tolerated it well.   30 Day ITP Review.  Lillianah is doing well with her recent start in the program.  She tolerating increases in workload well.  She is socializing and helping other participants.        Comments: See ITP Comments.

## 2018-03-30 ENCOUNTER — Encounter (HOSPITAL_COMMUNITY)
Admission: RE | Admit: 2018-03-30 | Discharge: 2018-03-30 | Disposition: A | Discharge status: Home / Self Care | Payer: BLUE CROSS/BLUE SHIELD | Source: Ambulatory Visit | Attending: Cardiology | Admitting: Cardiology

## 2018-03-30 ENCOUNTER — Encounter (HOSPITAL_COMMUNITY): Payer: BLUE CROSS/BLUE SHIELD

## 2018-03-30 DIAGNOSIS — Z955 Presence of coronary angioplasty implant and graft: Secondary | ICD-10-CM | POA: Diagnosis not present

## 2018-04-02 ENCOUNTER — Encounter (HOSPITAL_COMMUNITY): Payer: BLUE CROSS/BLUE SHIELD

## 2018-04-02 ENCOUNTER — Encounter (HOSPITAL_COMMUNITY)
Admission: RE | Admit: 2018-04-02 | Discharge: 2018-04-02 | Disposition: A | Discharge status: Home / Self Care | Payer: BLUE CROSS/BLUE SHIELD | Source: Ambulatory Visit | Attending: Cardiology | Admitting: Cardiology

## 2018-04-02 DIAGNOSIS — Z955 Presence of coronary angioplasty implant and graft: Secondary | ICD-10-CM | POA: Diagnosis not present

## 2018-04-04 ENCOUNTER — Encounter (HOSPITAL_COMMUNITY): Payer: BLUE CROSS/BLUE SHIELD

## 2018-04-04 ENCOUNTER — Encounter (HOSPITAL_COMMUNITY)
Admission: RE | Admit: 2018-04-04 | Discharge: 2018-04-04 | Disposition: A | Discharge status: Home / Self Care | Payer: BLUE CROSS/BLUE SHIELD | Source: Ambulatory Visit | Attending: Cardiology | Admitting: Cardiology

## 2018-04-04 ENCOUNTER — Other Ambulatory Visit: Payer: BLUE CROSS/BLUE SHIELD | Admitting: *Deleted

## 2018-04-04 DIAGNOSIS — Z955 Presence of coronary angioplasty implant and graft: Secondary | ICD-10-CM

## 2018-04-04 DIAGNOSIS — E785 Hyperlipidemia, unspecified: Secondary | ICD-10-CM

## 2018-04-04 LAB — HEPATIC FUNCTION PANEL
ALBUMIN: 4.3 g/dL (ref 3.6–4.8)
ALK PHOS: 74 IU/L (ref 39–117)
ALT: 14 IU/L (ref 0–32)
AST: 8 IU/L (ref 0–40)
BILIRUBIN TOTAL: 1 mg/dL (ref 0.0–1.2)
BILIRUBIN, DIRECT: 0.2 mg/dL (ref 0.00–0.40)
TOTAL PROTEIN: 6.7 g/dL (ref 6.0–8.5)

## 2018-04-04 LAB — LIPID PANEL
Chol/HDL Ratio: 3 ratio (ref 0.0–4.4)
Cholesterol, Total: 149 mg/dL (ref 100–199)
HDL: 50 mg/dL (ref >39)
LDL Calculated: 72 mg/dL (ref 0–99)
Triglycerides: 136 mg/dL (ref 0–149)
VLDL CHOLESTEROL CAL: 27 mg/dL (ref 5–40)

## 2018-04-06 ENCOUNTER — Encounter (HOSPITAL_COMMUNITY): Payer: BLUE CROSS/BLUE SHIELD

## 2018-04-06 ENCOUNTER — Encounter (HOSPITAL_COMMUNITY)
Admission: RE | Admit: 2018-04-06 | Discharge: 2018-04-06 | Disposition: A | Discharge status: Home / Self Care | Payer: BLUE CROSS/BLUE SHIELD | Source: Ambulatory Visit | Attending: Cardiology | Admitting: Cardiology

## 2018-04-06 DIAGNOSIS — Z955 Presence of coronary angioplasty implant and graft: Secondary | ICD-10-CM | POA: Diagnosis not present

## 2018-04-09 ENCOUNTER — Encounter (HOSPITAL_COMMUNITY): Payer: BLUE CROSS/BLUE SHIELD

## 2018-04-09 ENCOUNTER — Encounter (HOSPITAL_COMMUNITY)
Admission: RE | Admit: 2018-04-09 | Discharge: 2018-04-09 | Disposition: A | Discharge status: Home / Self Care | Payer: BLUE CROSS/BLUE SHIELD | Source: Ambulatory Visit | Attending: Cardiology | Admitting: Cardiology

## 2018-04-09 DIAGNOSIS — Z955 Presence of coronary angioplasty implant and graft: Secondary | ICD-10-CM | POA: Diagnosis not present

## 2018-04-10 ENCOUNTER — Ambulatory Visit: Payer: BLUE CROSS/BLUE SHIELD | Admitting: Cardiology

## 2018-04-10 ENCOUNTER — Encounter: Payer: Self-pay | Admitting: Cardiology

## 2018-04-10 VITALS — BP 140/70 | HR 60 | Ht 61.0 in | Wt 178.8 lb

## 2018-04-10 DIAGNOSIS — I10 Essential (primary) hypertension: Secondary | ICD-10-CM

## 2018-04-10 DIAGNOSIS — I2510 Atherosclerotic heart disease of native coronary artery without angina pectoris: Secondary | ICD-10-CM | POA: Diagnosis not present

## 2018-04-10 DIAGNOSIS — R0600 Dyspnea, unspecified: Secondary | ICD-10-CM | POA: Diagnosis not present

## 2018-04-10 NOTE — Patient Instructions (Signed)
Medication Instructions:  The current medical regimen is effective;  continue present plan and medications.  Follow-Up: Follow up in 6 months with Lori Gerhardt, NP.  You will receive a letter in the mail 2 months before you are due.  Please call us when you receive this letter to schedule your follow up appointment.  Follow up in 1 year with Dr. Skains.  You will receive a letter in the mail 2 months before you are due.  Please call us when you receive this letter to schedule your follow up appointment.  If you need a refill on your cardiac medications before your next appointment, please call your pharmacy.  Thank you for choosing [facility]!!     

## 2018-04-10 NOTE — Progress Notes (Signed)
Cardiology Office Note    Date:  04/10/2018   ID:  Summer Hodge, Summer Hodge [birth year], MRN [id number]  PCP:  Darcus Austin, MD  Cardiologist:   Candee Furbish, MD     History of Present Illness:  Summer Hodge is a 62 y.o. female here for follow-up of coronary artery disease status post LAD and RCA stent placement in May 2019. She has risk factors of diabetes, hyperlipidemia, hypertension as well as a brother who died of myocardial infarction at age 17. She had another brother who died at age 18 with myocardial infarction as well. Many years ago she had a nuclear stress test that was low risk, Dr. Lia Foyer.  Previously was complaining of increasing dyspnea with flight of stairs. Dizziness sudden. Fluttering sensation in throat, 3-4 min. Pain in right arm. No chest pain.   In the past Crestor caused muscle aches, Lipitor causes muscle aches and pravastatin cause muscle aches.  04/10/2018-overall she has been doing quite well.  She still feeling some shortness of breath and fatigue however.  She has been doing really well with Crestor 3 times a week 10 mg.  Mild joint aches or muscle pain.  She has been enjoying cardiac rehab.  No chest pain.  She has had some minor bruising and sub-conjunctival hemorrhage noted.  No syncope.  Pressure under excellent control.  Past Medical History:  Diagnosis Date  . Allergic rhinitis   . Anemia   . Anginal pain ([place])   . Arthritis   . Coronary artery disease    12/20/17- DES to mid RCA & LAD. other non-obstructive disease.   . Crohn disease ([place])   . Depression   . Diabetes mellitus without complication ([place])   . Family history of heart disease   . GERD (gastroesophageal reflux disease)   . Hyperlipidemia   . Hypertension   . Osteopenia   . Sleep apnea   . Vitamin D deficiency     Past Surgical History:  Procedure Laterality Date  . CARDIAC CATHETERIZATION    . CESAREAN SECTION    . CHOLECYSTECTOMY, LAPAROSCOPIC  2001  . CORONARY STENT  INTERVENTION N/A 12/20/2017   Procedure: CORONARY STENT INTERVENTION;  Surgeon: Burnell Blanks, MD;  Location: [facility];  Service: Cardiovascular;  Laterality: N/A;  . HEMORRHOID SURGERY  04/2010  . LEFT HEART CATH AND CORONARY ANGIOGRAPHY N/A 12/20/2017   Procedure: LEFT HEART CATH AND CORONARY ANGIOGRAPHY;  Surgeon: Burnell Blanks, MD;  Location: [facility];  Service: Cardiovascular;  Laterality: N/A;    Current Medications: Outpatient Medications Prior to Visit  Medication Sig Dispense Refill  . aspirin EC 81 MG tablet Take 81 mg by mouth at bedtime.    Marland Kitchen ezetimibe (ZETIA) 10 MG tablet Take 10 mg by mouth daily.   1  . glipiZIDE (GLUCOTROL XL) 5 MG 24 hr tablet Take 5 mg by mouth daily.  0  . lisinopril (PRINIVIL,ZESTRIL) 10 MG tablet Take 10 mg by mouth daily.    Vladimir Faster Glycol-Propyl Glycol (SYSTANE) 0.4-0.3 % SOLN Apply 1 drop to eye 2 (two) times a week.    . rosuvastatin (CRESTOR) 10 MG tablet Take 1 tablet three times weekly and increase as tolerated. 30 tablet 11  . ticagrelor (BRILINTA) 90 MG TABS tablet Take 1 tablet (90 mg total) by mouth 2 (two) times daily. 60 tablet 11  . carvedilol (COREG) 3.125 MG tablet Take 1 tablet (3.125 mg total) by mouth 2 (two) times daily.  180 tablet 3  . nitroGLYCERIN (NITROSTAT) 0.4 MG SL tablet Place 1 tablet (0.4 mg total) under the tongue every 5 (five) minutes as needed for chest pain. 25 tablet 4   No facility-administered medications prior to visit.      Allergies:   Crestor [rosuvastatin]; Lipitor [atorvastatin]; and Pravastatin   Social History   Socioeconomic History  . Marital status: Married    Spouse name: Not on file  . Number of children: Not on file  . Years of education: 12 plus  . Highest education level: Not on file  Occupational History  . Not on file  Social Needs  . Financial resource strain: Not on file  . Food insecurity:    Worry: Not on file    Inability: Not on file  .  Transportation needs:    Medical: Not on file    Non-medical: Not on file  Tobacco Use  . Smoking status: Former Smoker    Types: Cigarettes    Last attempt to quit: 06/23/1975    Years since quitting: 42.8  . Smokeless tobacco: Never Used  . Tobacco comment: SMOKED BRIEFLY IN EARLY 20'S  Substance and Sexual Activity  . Alcohol use: Yes    Comment: 1 BEER OR WINE 3-4 X A WEEK  . Drug use: No  . Sexual activity: Yes    Partners: Male    Comment: RARELY  Lifestyle  . Physical activity:    Days per week: 4 days    Minutes per session: 30 min  . Stress: Very much  Relationships  . Social connections:    Talks on phone: Not on file    Gets together: Not on file    Attends religious service: Not on file    Active member of club or organization: Not on file    Attends meetings of clubs or organizations: Not on file    Relationship status: Not on file  Other Topics Concern  . Not on file  Social History Narrative  . Not on file     Family History:  The patient's family history includes CAD in her brother, brother, brother, brother, father, and paternal grandfather; Cancer in her mother; Diabetes in her father and mother; Healthy in her son; Heart attack in her brother, brother, and paternal grandfather; Heart disease in her brother; Hepatitis C in her brother; Hypertension in her brother, brother, and mother; Hypotension in her father; Mental illness in her brother.   ROS:   Please see the history of present illness.    Review of Systems  All other systems reviewed and are negative.     PHYSICAL EXAM:   VS:  BP 140/70   Pulse 60   Ht 5' 1"  (1.549 m)   Wt 178 lb 12.8 oz (81.1 kg)   LMP  (LMP Unknown)   SpO2 98%   BMI 33.78 kg/m    GEN: Well nourished, well developed, in no acute distress  HEENT: Some conjunctival hemorrhage noted, right eye Neck: no JVD, carotid bruits, or masses Cardiac: RRR; no murmurs, rubs, or gallops,no edema  Respiratory:  clear to auscultation  bilaterally, normal work of breathing GI: soft, nontender, nondistended, + BS MS: no deformity or atrophy  Skin: warm and dry, no rash Neuro:  Alert and Oriented x 3, Strength and sensation are intact Psych: euthymic mood, full affect   Wt Readings from Last 3 Encounters:  04/10/18 178 lb 12.8 oz (81.1 kg)  02/27/18 179 lb 7.3 oz (81.4 kg)  01/05/18 182 lb (82.6 kg)      Studies/Labs Reviewed:   EKG:  07/07/16-sinus rhythm, 79, no other abnormalities personally viewed.  Recent Labs: 12/21/2017: BUN 12; Creatinine, Ser 0.81; Hemoglobin 13.2; Platelets 282; Potassium 4.1; Sodium 140 04/04/2018: ALT 14   Lipid Panel    Component Value Date/Time   CHOL 149 04/04/2018 0854   TRIG 136 04/04/2018 0854   HDL 50 04/04/2018 0854   CHOLHDL 3.0 04/04/2018 0854   CHOLHDL 4.3 CALC 07/15/2008 0818   VLDL 35 07/15/2008 0818   LDLCALC 72 04/04/2018 0854    Additional studies/ records that were reviewed today include:  Prior office notes reviewed, prior nuclear stress test the early 1990 mid 1990s was low risk, normal EF  Cath 12/20/17 1. Severe stenosis mid RCA 2. Successful PTCA/DES x 1 mid RCA 3. Severe stenosis mid LAD.  4. Successful PTCA/DES x 1 mid LAD 5. Moderate non-obstructive disease in the distal RCA branches, ostial Circumflex, distal Circumflex, proximal LAD 6. Normal LV systolic function  Recommendations: Continue DAPT with ASA and Brilinta for one year. Continue low dose statin. Given statin intolerance, consider Praluent or Repatha. Continue beta blocker.   ASSESSMENT:    1. Coronary artery disease involving native coronary artery of native heart without angina pectoris   2. Dyspnea, unspecified type   3. Essential hypertension      PLAN:  In order of problems listed above:  CAD  - Cath with RCA/LAD stent. 3rd week cardiac rehab now. Still with some SOB, fatigue.  Continued encouragement.  Blood pressure is under excellent control.  Continue with dual  antiplatelet therapy for 1 year.  If Brilinta gets too expensive, we can switch to Plavix.  Had some sub-conjunctival hemorrhage that is occurred.   Hyperlipidemia  - LDL 179 at one point. Now 62.   - Crestor 10 3x week. Some joint and muscle.   - can try coenzyme Q10 again  Diabetes mellitus  - Coronary artery disease equivalent.  - Explained to her previously that all comers with diabetes deserved to be on statin medication regardless of cholesterol levels.  Essential hypertension  - Dr. Darcus Austin recently increased her lisinopril. Blood pressures have improved.  Fluttering sensation in throat  - Could be PVCs or PACs.  - No high-risk symptoms such as syncope.  67-monthfollow-up LCecille Rubin 12 months me  Medication Adjustments/Labs and Tests Ordered: Current medicines are reviewed at length with the patient today.  Concerns regarding medicines are outlined above.  Medication changes, Labs and Tests ordered today are listed in the Patient Instructions below. Patient Instructions  Medication Instructions:  The current medical regimen is effective;  continue present plan and medications.  Follow-Up: Follow up in 6 months with LTruitt Merle NP.  You will receive a letter in the mail 2 months before you are due.  Please call [place]hen you receive this letter to schedule your follow up appointment.  Follow up in 1 year with Dr. SMarlou Porch  You will receive a letter in the mail 2 months before you are due.  Please call [place]hen you receive this letter to schedule your follow up appointment.  If you need a refill on your cardiac medications before your next appointment, please call your pharmacy.  [facility]        Signed, MCandee Furbish MD  04/10/2018 11:49 AM    [address] [place]  [id number]: ([phone]  [phone]; Fax: [phone]

## 2018-04-11 ENCOUNTER — Encounter (HOSPITAL_COMMUNITY): Payer: BLUE CROSS/BLUE SHIELD

## 2018-04-11 ENCOUNTER — Encounter (HOSPITAL_COMMUNITY)
Admission: RE | Admit: 2018-04-11 | Discharge: 2018-04-11 | Disposition: A | Discharge status: Home / Self Care | Payer: BLUE CROSS/BLUE SHIELD | Source: Ambulatory Visit | Attending: Cardiology | Admitting: Cardiology

## 2018-04-11 DIAGNOSIS — Z955 Presence of coronary angioplasty implant and graft: Secondary | ICD-10-CM

## 2018-04-13 ENCOUNTER — Encounter (HOSPITAL_COMMUNITY): Payer: BLUE CROSS/BLUE SHIELD

## 2018-04-13 ENCOUNTER — Encounter (HOSPITAL_COMMUNITY)
Admission: RE | Admit: 2018-04-13 | Discharge: 2018-04-13 | Disposition: A | Discharge status: Home / Self Care | Payer: BLUE CROSS/BLUE SHIELD | Source: Ambulatory Visit | Attending: Cardiology | Admitting: Cardiology

## 2018-04-13 DIAGNOSIS — Z955 Presence of coronary angioplasty implant and graft: Secondary | ICD-10-CM | POA: Diagnosis not present

## 2018-04-16 ENCOUNTER — Encounter (HOSPITAL_COMMUNITY)
Admission: RE | Admit: 2018-04-16 | Discharge: 2018-04-16 | Disposition: A | Discharge status: Home / Self Care | Payer: BLUE CROSS/BLUE SHIELD | Source: Ambulatory Visit | Attending: Cardiology | Admitting: Cardiology

## 2018-04-16 ENCOUNTER — Encounter (HOSPITAL_COMMUNITY): Payer: BLUE CROSS/BLUE SHIELD

## 2018-04-16 DIAGNOSIS — Z955 Presence of coronary angioplasty implant and graft: Secondary | ICD-10-CM | POA: Diagnosis not present

## 2018-04-18 ENCOUNTER — Encounter (HOSPITAL_COMMUNITY): Payer: BLUE CROSS/BLUE SHIELD

## 2018-04-18 ENCOUNTER — Encounter (HOSPITAL_COMMUNITY)
Admission: RE | Admit: 2018-04-18 | Discharge: 2018-04-18 | Disposition: A | Discharge status: Home / Self Care | Payer: BLUE CROSS/BLUE SHIELD | Source: Ambulatory Visit | Attending: Cardiology | Admitting: Cardiology

## 2018-04-18 ENCOUNTER — Telehealth: Payer: Self-pay | Admitting: *Deleted

## 2018-04-18 DIAGNOSIS — Z955 Presence of coronary angioplasty implant and graft: Secondary | ICD-10-CM

## 2018-04-18 NOTE — Telephone Encounter (Signed)
   [facility] Pre-operative Risk Assessment    Request for surgical clearance:  1. What type of surgery is being performed? ROUTINE DENTAL CLEANING   2. When is this surgery scheduled? TBD   3. What type of clearance is required (medical clearance vs. Pharmacy clearance to hold med vs. Both)?  BOTH  4. Are there any medications that need to be held prior to surgery and how long?  NO SPECIFIC MEDS SPECIFIED, BUT PT IS ON ASA AND BRILINTA- CLEARANCE IS ASKING IF PATIENT NEEDS PROPHYLACTIC ANTIBIOTICS PRIOR TO CLEANING  5. Practice name and name of physician performing surgery?  [facility]   6. What is your office phone number [phone]    7.   What is your office fax number [phone]  8.   Anesthesia type (None, local, MAC, general) ?  NONE SPECIFIED   Jonuel Butterfield M 04/18/2018, 11:23 AM  _________________________________________________________________   (provider comments below)

## 2018-04-18 NOTE — Telephone Encounter (Signed)
Antibiotics prior to dental cleaning/procedure not needed from cardiology standpoint.   Burtis Junes, RN, [place] [address] [place], [place]  [address] [phone].Marland Kitchen

## 2018-04-18 NOTE — Telephone Encounter (Signed)
Spoke with the pt and informed her that I have printed off her dental clearance form, entered this in Epic, and this will be routed to our Pre-op pool/Providers to review and advise on her dental cleaning needed.  Informed the pt that once our APPs advise on her clearance, this will be faxed back to her DDS, for further follow-up with the pt.  Pt verbalized understanding and agrees with this plan.

## 2018-04-19 DIAGNOSIS — F4010 Social phobia, unspecified: Secondary | ICD-10-CM | POA: Diagnosis not present

## 2018-04-19 DIAGNOSIS — F341 Dysthymic disorder: Secondary | ICD-10-CM | POA: Diagnosis not present

## 2018-04-20 ENCOUNTER — Encounter (HOSPITAL_COMMUNITY): Payer: Self-pay

## 2018-04-20 ENCOUNTER — Encounter (HOSPITAL_COMMUNITY): Payer: BLUE CROSS/BLUE SHIELD

## 2018-04-20 ENCOUNTER — Encounter (HOSPITAL_COMMUNITY)
Admission: RE | Admit: 2018-04-20 | Discharge: 2018-04-20 | Disposition: A | Discharge status: Home / Self Care | Payer: BLUE CROSS/BLUE SHIELD | Source: Ambulatory Visit | Attending: Cardiology | Admitting: Cardiology

## 2018-04-20 DIAGNOSIS — Z955 Presence of coronary angioplasty implant and graft: Secondary | ICD-10-CM | POA: Diagnosis not present

## 2018-04-25 ENCOUNTER — Encounter (HOSPITAL_COMMUNITY): Payer: BLUE CROSS/BLUE SHIELD

## 2018-04-25 ENCOUNTER — Encounter (HOSPITAL_COMMUNITY)
Admission: RE | Admit: 2018-04-25 | Discharge: 2018-04-25 | Disposition: A | Discharge status: Home / Self Care | Payer: BLUE CROSS/BLUE SHIELD | Source: Ambulatory Visit | Attending: Cardiology | Admitting: Cardiology

## 2018-04-25 DIAGNOSIS — Z955 Presence of coronary angioplasty implant and graft: Secondary | ICD-10-CM

## 2018-04-26 NOTE — Progress Notes (Signed)
Cardiac Individual Treatment Plan  Patient Details  Name: Summer Hodge MRN: [id number] Date of Birth: 02-22-1956 Referring Provider:     CARDIAC REHAB PHASE II ORIENTATION from 02/27/2018 in [facility]  Referring Provider  Candee Furbish MD      Initial Encounter Date:    CARDIAC REHAB PHASE II ORIENTATION from 02/27/2018 in [facility]  Date  02/27/18      Visit Diagnosis: Stented coronary artery 12/20/17 DES RCA ,LAD  Patient's Home Medications on Admission:  Current Outpatient Medications:  .  aspirin EC 81 MG tablet, Take 81 mg by mouth at bedtime., Disp: , Rfl:  .  carvedilol (COREG) 3.125 MG tablet, Take 1 tablet (3.125 mg total) by mouth 2 (two) times daily., Disp: 180 tablet, Rfl: 3 .  ezetimibe (ZETIA) 10 MG tablet, Take 10 mg by mouth daily. , Disp: , Rfl: 1 .  glipiZIDE (GLUCOTROL XL) 5 MG 24 hr tablet, Take 5 mg by mouth daily., Disp: , Rfl: 0 .  lisinopril (PRINIVIL,ZESTRIL) 10 MG tablet, Take 10 mg by mouth daily., Disp: , Rfl:  .  nitroGLYCERIN (NITROSTAT) 0.4 MG SL tablet, Place 1 tablet (0.4 mg total) under the tongue every 5 (five) minutes as needed for chest pain., Disp: 25 tablet, Rfl: 4 .  Polyethyl Glycol-Propyl Glycol (SYSTANE) 0.4-0.3 % SOLN, Apply 1 drop to eye 2 (two) times a week., Disp: , Rfl:  .  rosuvastatin (CRESTOR) 10 MG tablet, Take 1 tablet three times weekly and increase as tolerated., Disp: 30 tablet, Rfl: 11 .  ticagrelor (BRILINTA) 90 MG TABS tablet, Take 1 tablet (90 mg total) by mouth 2 (two) times daily., Disp: 60 tablet, Rfl: 11  Past Medical History: Past Medical History:  Diagnosis Date  . Allergic rhinitis   . Anemia   . Anginal pain ([place])   . Arthritis   . Coronary artery disease    12/20/17- DES to mid RCA & LAD. other non-obstructive disease.   . Crohn disease ([place])   . Depression   . Diabetes mellitus without complication ([place])   . Family history of heart disease   .  GERD (gastroesophageal reflux disease)   . Hyperlipidemia   . Hypertension   . Osteopenia   . Sleep apnea   . Vitamin D deficiency     Tobacco Use: Social History   Tobacco Use  Smoking Status Former Smoker  . Types: Cigarettes  . Last attempt to quit: 06/23/1975  . Years since quitting: 42.8  Smokeless Tobacco Never Used  Tobacco Comment   SMOKED BRIEFLY IN EARLY 20'S    Labs: Recent Review Flowsheet Data    Labs for ITP Cardiac and Pulmonary Rehab Latest Ref Rng & Units 01/04/2007 07/23/2007 07/15/2008 05/17/2013 04/04/2018   Cholestrol 100 - 199 mg/dL 171 179 167 - 149   LDLCALC 0 - 99 mg/dL 95 103(H) 93 - 72   HDL >39 mg/dL 43.1 44.7 39.2 - 50   Trlycerides 0 - 149 mg/dL 164(H) 157(H) 174(H) - 136   TCO2 0 - 100 mmol/L - - - 26 -      Capillary Blood Glucose: Lab Results  Component Value Date   GLUCAP 119 (H) 12/21/2017   GLUCAP 95 12/20/2017   GLUCAP 112 (H) 12/20/2017   GLUCAP 103 (H) 12/20/2017     Exercise Target Goals: Exercise Program Goal: Individual exercise prescription set using results from initial 6 min walk test and THRR while considering  patient's activity barriers and safety.   Exercise Prescription Goal: Initial exercise prescription builds to 30-45 minutes a day of aerobic activity, 2-3 days per week.  Home exercise guidelines will be given to patient during program as part of exercise prescription that the participant will acknowledge.  Activity Barriers & Risk Stratification: Activity Barriers & Cardiac Risk Stratification - 02/27/18 1029      Activity Barriers & Cardiac Risk Stratification   Activity Barriers  Deconditioning;Muscular Weakness;Joint Problems;Other (comment)    Comments  myalgia and R knee discomfort    Cardiac Risk Stratification  Moderate       6 Minute Walk: 6 Minute Walk    Row Name 02/27/18 1009 02/27/18 1108       6 Minute Walk   Phase  Initial  -    Distance  1675 feet  -    Walk Time  6 minutes  -    # of  Rest Breaks  0  -    MPH  3.2  -    METS  3.4  -    RPE  12  -    VO2 Peak  12.1  -    Symptoms  Yes (comment)  -    Comments  twinge in chest region, L-sided  -    Resting HR  68 bpm  -    Resting BP  128/68  -    Resting Oxygen Saturation   98 %  -    Exercise Oxygen Saturation  during 6 min walk  98 %  -    Max Ex. HR  93 bpm  -    Max Ex. BP  140/80  -    2 Minute Post BP  -  122/68       Oxygen Initial Assessment:   Oxygen Re-Evaluation:   Oxygen Discharge (Final Oxygen Re-Evaluation):   Initial Exercise Prescription: Initial Exercise Prescription - 02/27/18 1000      Date of Initial Exercise RX and Referring Provider   Date  02/27/18    Referring Provider  Candee Furbish MD    Expected Discharge Date  06/04/18      Treadmill   MPH  3    Grade  0    Minutes  10    METs  3.3      Bike   Level  0.7    Minutes  10    METs  2.65      NuStep   Level  3    SPM  80    Minutes  10    METs  2.5      Prescription Details   Frequency (times per week)  3    Duration  Progress to 30 minutes of continuous aerobic without signs/symptoms of physical distress      Intensity   THRR 40-80% of Max Heartrate  62-125    Ratings of Perceived Exertion  11-13    Perceived Dyspnea  0-4      Progression   Progression  Continue to progress workloads to maintain intensity without signs/symptoms of physical distress.      Resistance Training   Training Prescription  Yes    Weight  3lbs    Reps  10-15       Perform Capillary Blood Glucose checks as needed.  Exercise Prescription Changes: Exercise Prescription Changes    Row Name 03/12/18 1000 03/26/18 0805 04/11/18 0800 04/20/18 1419       Response to Exercise  Blood Pressure (Admit)  160/80 Recheck: 148/86  132/72 Recheck: 148/86  140/70  120/74    Blood Pressure (Exercise)  148/92  142/80  164/78  150/80    Blood Pressure (Exit)  130/82  130/72  120/70  120/70    Heart Rate (Admit)  73 bpm  69 bpm  72 bpm  78  bpm    Heart Rate (Exercise)  102 bpm  120 bpm  126 bpm  116 bpm    Heart Rate (Exit)  72 bpm  73 bpm  72 bpm  84 bpm    Rating of Perceived Exertion (Exercise)  12  13  12  11     Perceived Dyspnea (Exercise)  0  0  0  0    Symptoms  Elevated BP   None   None   None     Comments  Pt oriented to exercise equipment  -  -  Added Recumbent Bike in place of Airdyne bike per pt's request     Duration  Progress to 30 minutes of  aerobic without signs/symptoms of physical distress  Continue with 30 min of aerobic exercise without signs/symptoms of physical distress.  Continue with 30 min of aerobic exercise without signs/symptoms of physical distress.  Continue with 30 min of aerobic exercise without signs/symptoms of physical distress.    Intensity  THRR New  THRR unchanged  THRR unchanged  THRR unchanged      Progression   Progression  Continue to progress workloads to maintain intensity without signs/symptoms of physical distress.  Continue to progress workloads to maintain intensity without signs/symptoms of physical distress.  Continue to progress workloads to maintain intensity without signs/symptoms of physical distress.  Continue to progress workloads to maintain intensity without signs/symptoms of physical distress.    Average METs  2.63  2.9  3.95  3.9      Resistance Training   Training Prescription  Yes  Yes  No  Yes    Weight  3lbs  3lbs  -  4lbs    Reps  10-15  10-15  -  10-15    Time  10 Minutes  10 Minutes  -  10 Minutes      Interval Training   Interval Training  No  No  No  No      Treadmill   MPH  3  3  3   3.2    Grade  0  0  0  2    Minutes  10  10  10  10     METs  3.3  3.3  3.3  4.33      Bike   Level  0.7  1.7  1.7  -    Minutes  10  10  10   -    METs  2.62  2.61  4.64  -      Recumbant Bike   Level  -  -  -  2    Minutes  -  -  -  10    METs  -  -  -  3.4      NuStep   Level  3  4  5  5     SPM  80  105  110  110    Minutes  10  10  10  10     METs  1.98  2.8   4.1  4.1      Home Exercise Plan   Plans to continue exercise at  -  Home (comment) Treadmill @ Home   Home (comment) Treadmill @ Home   Home (comment) Treadmill @ Home     Frequency  -  Add 2 additional days to program exercise sessions.  Add 2 additional days to program exercise sessions.  Add 2 additional days to program exercise sessions.    Initial Home Exercises Provided  -  03/21/18  03/21/18  03/21/18       Exercise Comments: Exercise Comments    Row Name 03/12/18 1034 03/21/18 0715 04/25/18 1422       Exercise Comments  Pt 's first day of exericse. Pt was oriented to exercise equipment. Pt responded well to exericse prescription. Will continue to monitor and progress pt.   Reviewed home exercise guidelines with patient.  Reviewed METs and Goals with pt. Pt is responding well to exercise prescriptions and puts forth great effort with exercise. Will continue to monitor and progress pt as tolerated.         Exercise Goals and Review: Exercise Goals    Row Name 02/27/18 0816             Exercise Goals   Increase Physical Activity  Yes       Intervention  Provide advice, education, support and counseling about physical activity/exercise needs.;Develop an individualized exercise prescription for aerobic and resistive training based on initial evaluation findings, risk stratification, comorbidities and participant's personal goals.       Expected Outcomes  Short Term: Attend rehab on a regular basis to increase amount of physical activity.;Long Term: Add in home exercise to make exercise part of routine and to increase amount of physical activity.;Long Term: Exercising regularly at least 3-5 days a week.       Increase Strength and Stamina  Yes       Intervention  Provide advice, education, support and counseling about physical activity/exercise needs.;Develop an individualized exercise prescription for aerobic and resistive training based on initial evaluation findings, risk  stratification, comorbidities and participant's personal goals.       Expected Outcomes  Short Term: Increase workloads from initial exercise prescription for resistance, speed, and METs.;Short Term: Perform resistance training exercises routinely during rehab and add in resistance training at home;Long Term: Improve cardiorespiratory fitness, muscular endurance and strength as measured by increased METs and functional capacity (6MWT)       Able to understand and use rate of perceived exertion (RPE) scale  Yes       Intervention  Provide education and explanation on how to use RPE scale       Expected Outcomes  Short Term: Able to use RPE daily in rehab to express subjective intensity level;Long Term:  Able to use RPE to guide intensity level when exercising independently       Knowledge and understanding of Target Heart Rate Range (THRR)  Yes       Intervention  Provide education and explanation of THRR including how the numbers were predicted and where they are located for reference       Expected Outcomes  Short Term: Able to state/look up THRR;Short Term: Able to use daily as guideline for intensity in rehab;Long Term: Able to use THRR to govern intensity when exercising independently       Able to check pulse independently  Yes       Intervention  Provide education and demonstration on how to check pulse in carotid and radial arteries.;Review the importance of being able to check your own pulse for safety  during independent exercise       Expected Outcomes  Short Term: Able to explain why pulse checking is important during independent exercise;Long Term: Able to check pulse independently and accurately       Understanding of Exercise Prescription  Yes       Intervention  Provide education, explanation, and written materials on patient's individual exercise prescription       Expected Outcomes  Short Term: Able to explain program exercise prescription;Long Term: Able to explain home exercise  prescription to exercise independently          Exercise Goals Re-Evaluation : Exercise Goals Re-Evaluation    Row Name 03/21/18 0715 04/25/18 1431           Exercise Goal Re-Evaluation   Exercise Goals Review  Understanding of Exercise Prescription;Increase Physical Activity;Knowledge and understanding of Target Heart Rate Range (THRR);Able to understand and use rate of perceived exertion (RPE) scale  Increase Physical Activity;Understanding of Exercise Prescription      Comments  Reviewed home exercise guidelines with patient including THRR, RPE scale and endpoints for exercise. Pt has a treadmill at home, which she plans to use as her mode of home exercise.  Pt has not started exercising at home yet. One of pt's goal is to lose weight. Discussed with pt the importance of exercising at home and diet. Pt states she has a hard time after work, exercising.       Expected Outcomes  Pt will walk on home treadmill 30-40 minutes, at least 2 days/week in addition to exercise at cardiac rehab.  Pt will plan to walk at home 1 day at home on treadmill. Pt will start with one day and eventually add an additional day once exercising at home once a week consistently. Will continue to monitor pt's progress with exercising at home.          Discharge Exercise Prescription (Final Exercise Prescription Changes): Exercise Prescription Changes - 04/20/18 1419      Response to Exercise   Blood Pressure (Admit)  120/74    Blood Pressure (Exercise)  150/80    Blood Pressure (Exit)  120/70    Heart Rate (Admit)  78 bpm    Heart Rate (Exercise)  116 bpm    Heart Rate (Exit)  84 bpm    Rating of Perceived Exertion (Exercise)  11    Perceived Dyspnea (Exercise)  0    Symptoms  None     Comments  Added Recumbent Bike in place of Airdyne bike per pt's request     Duration  Continue with 30 min of aerobic exercise without signs/symptoms of physical distress.    Intensity  THRR unchanged      Progression    Progression  Continue to progress workloads to maintain intensity without signs/symptoms of physical distress.    Average METs  3.9      Resistance Training   Training Prescription  Yes    Weight  4lbs    Reps  10-15    Time  10 Minutes      Interval Training   Interval Training  No      Treadmill   MPH  3.2    Grade  2    Minutes  10    METs  4.33      Recumbant Bike   Level  2    Minutes  10    METs  3.4      NuStep   Level  5  SPM  110    Minutes  10    METs  4.1      Home Exercise Plan   Plans to continue exercise at  Home (comment)   Treadmill @ Home    Frequency  Add 2 additional days to program exercise sessions.    Initial Home Exercises Provided  03/21/18       Nutrition:  Target Goals: Understanding of nutrition guidelines, daily intake of sodium 1500mg , cholesterol 200mg , calories 30% from fat and 7% or less from saturated fats, daily to have 5 or more servings of fruits and vegetables.  Biometrics: Pre Biometrics - 02/27/18 1026      Pre Biometrics   Height  5\' 1"  (1.549 m)    Weight  81.4 kg    Waist Circumference  40.5 inches    Hip Circumference  46 inches    Waist to Hip Ratio  0.88 %    BMI (Calculated)  33.93    Triceps Skinfold  33 mm    % Body Fat  45.5 %    Grip Strength  27 kg    Flexibility  15 in    Single Leg Stand  30 seconds        Nutrition Therapy Plan and Nutrition Goals: Nutrition Therapy & Goals - 03/14/18 0810      Nutrition Therapy   Diet  consistent carbohydrate heart healthy      Personal Nutrition Goals   Nutrition Goal  Pt to identify food quantities necessary to achieve weight loss of 6-24 lbs. at graduation from cardiac rehab. Goal wt of 20 lb desired.     Personal Goal #2  Pt to identify and limit food sources of saturated fat, trans fat, and sodium      Intervention Plan   Intervention  Prescribe, educate and counsel regarding individualized specific dietary modifications aiming towards targeted core  components such as weight, hypertension, lipid management, diabetes, heart failure and other comorbidities.    Expected Outcomes  Short Term Goal: Understand basic principles of dietary content, such as calories, fat, sodium, cholesterol and nutrients.       Nutrition Assessments: Nutrition Assessments - 02/27/18 0858      MEDFICTS Scores   Pre Score  47       Nutrition Goals Re-Evaluation:   Nutrition Goals Re-Evaluation:   Nutrition Goals Discharge (Final Nutrition Goals Re-Evaluation):   Psychosocial: Target Goals: Acknowledge presence or absence of significant depression and/or stress, maximize coping skills, provide positive support system. Participant is able to verbalize types and ability to use techniques and skills needed for reducing stress and depression.  Initial Review & Psychosocial Screening: Initial Psych Review & Screening - 02/27/18 1121      Initial Review   Current issues with  History of Depression   Velvet has a history of depression and sees a therapist on a regular basis     [place]?  Yes   Tyteanna has her counselor for support   Comments  Sarena recently lost her dog of 15 years      Barriers   Psychosocial barriers to participate in program  The patient should benefit from training in stress management and relaxation.;Psychosocial barriers identified (see note)      Screening Interventions   Interventions  Encouraged to exercise;To provide support and resources with identified psychosocial needs;Provide feedback about the scores to participant    Expected Outcomes  Short Term goal: Utilizing psychosocial counselor, staff  and physician to assist with identification of specific Stressors or current issues interfering with healing process. Setting desired goal for each stressor or current issue identified.;Long Term Goal: Stressors or current issues are controlled or eliminated.;Short Term goal: Identification and review  with participant of any Quality of Life or Depression concerns found by scoring the questionnaire.;Long Term goal: The participant improves quality of Life and PHQ9 Scores as seen by post scores and/or verbalization of changes       Quality of Life Scores: Quality of Life - 02/27/18 1032      Quality of Life   Select  Quality of Life      Quality of Life Scores   Health/Function Pre  14.75 %    Socioeconomic Pre  17.43 %    Psych/Spiritual Pre  10.5 %    Family Pre  12.13 %    GLOBAL Pre  14.19 %      Scores of 19 and below usually indicate a poorer quality of life in these areas.  A difference of  2-3 points is a clinically meaningful difference.  A difference of 2-3 points in the total score of the Quality of Life Index has been associated with significant improvement in overall quality of life, self-image, physical symptoms, and general health in studies assessing change in quality of life.  PHQ-9: Recent Review Flowsheet Data    Depression screen [facility] 2/9 03/12/2018   Decreased Interest 0   Down, Depressed, Hopeless 1   PHQ - 2 Score 1     Interpretation of Total Score  Total Score Depression Severity:  1-4 = Minimal depression, 5-9 = Mild depression, 10-14 = Moderate depression, 15-19 = Moderately severe depression, 20-27 = Severe depression   Psychosocial Evaluation and Intervention: Psychosocial Evaluation - 03/12/18 0924      Psychosocial Evaluation & Interventions   Interventions  Encouraged to exercise with the program and follow exercise prescription;Stress management education;Relaxation education    Comments  Makailee has a history of depression.  Some days she has "episodes" of depression.  Does not currently have a therapist or have a desire to see representative from [facility].   Anabia enjoys reading, walking, and watching TV.     Expected Outcomes  Kenni will endorse a positive outlook with less reported days of depression.     Continue  Psychosocial Services   Follow up required by staff       Psychosocial Re-Evaluation: Psychosocial Re-Evaluation    [place] Name 03/26/18 9563 04/20/18 1547           Psychosocial Re-Evaluation   Current issues with  History of Depression  History of Depression      Comments  No psychosocial needs identified at this time.  Stasia declined meeting with the spiritual care department.  No psychosocial needs identified at this time.        Expected Outcomes  Isolde will continue to maintain a positive outlook with good coping skills.  Arneisha will continue to maintain a positive outlook with good coping skills.      Interventions  Encouraged to attend Cardiac Rehabilitation for the exercise;Relaxation education  Encouraged to attend Cardiac Rehabilitation for the exercise;Relaxation education      Continue Psychosocial Services   Follow up required by staff  Follow up required by staff         Psychosocial Discharge (Final Psychosocial Re-Evaluation): Psychosocial Re-Evaluation - 04/20/18 1547      Psychosocial Re-Evaluation   Current issues with  History of Depression    Comments  No psychosocial needs identified at this time.      Expected Outcomes  Merriam will continue to maintain a positive outlook with good coping skills.    Interventions  Encouraged to attend Cardiac Rehabilitation for the exercise;Relaxation education    Continue Psychosocial Services   Follow up required by staff       Vocational Rehabilitation: Provide vocational rehab assistance to qualifying candidates.   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 Participant will  state understanding/return demonstration of topics presented.  Learning Barriers/Preferences: Learning Barriers/Preferences - 02/27/18 0816      Learning Barriers/Preferences   Learning Barriers  Sight    Learning Preferences  Audio;Video;Verbal Instruction       Education Topics: Count Your Pulse:  -Group instruction provided by verbal instruction, demonstration, patient participation and written materials to support subject.  Instructors address importance of being able to find your pulse and how to count your pulse when at home without a heart monitor.  Patients get hands on experience counting their pulse with staff help and individually.   CARDIAC REHAB PHASE II EXERCISE from 04/18/2018 in [facility]  Date  03/30/18  Educator  RN  Instruction Review Code  2- Demonstrated Understanding      Heart Attack, Angina, and Risk Factor Modification:  -Group instruction provided by verbal instruction, video, and written materials to support subject.  Instructors address signs and symptoms of angina and heart attacks.    Also discuss risk factors for heart disease and how to make changes to improve heart health risk factors.   CARDIAC REHAB PHASE II EXERCISE from 04/18/2018 in [facility]  Date  03/28/18  Educator  RN  Instruction Review Code  2- Demonstrated Understanding      Functional Fitness:  -Group instruction provided by verbal instruction, demonstration, patient participation, and written materials to support subject.  Instructors address safety measures for doing things around the house.  Discuss how to get up and down off the floor, how to pick things up properly, how to safely get out of a chair without assistance, and balance training.   CARDIAC REHAB PHASE II EXERCISE from 04/18/2018 in [facility]  Date  04/06/18  Instruction Review Code  2- Demonstrated Understanding       Meditation and Mindfulness:  -Group instruction provided by verbal instruction, patient participation, and written materials to support subject.  Instructor addresses importance of mindfulness and meditation practice to help reduce stress and improve awareness.  Instructor also leads participants through a meditation exercise.    CARDIAC REHAB PHASE II EXERCISE from 04/18/2018 in [facility]  Date  04/18/18  Educator  Jeanella Craze  Instruction Review Code  2- Demonstrated Understanding      Stretching for Flexibility and Mobility:  -Group instruction provided by verbal instruction, patient participation, and written materials to support subject.  Instructors lead participants through series of stretches that are designed to increase flexibility thus improving mobility.  These stretches are additional exercise for major muscle groups that are  typically performed during regular warm up and cool down.   Hands Only CPR:  -Group verbal, video, and participation provides a basic overview of AHA guidelines for community CPR. Role-play of emergencies allow participants the opportunity to practice calling for help and chest compression technique with discussion of AED use.   Hypertension: -Group verbal and written instruction that provides a basic overview of hypertension including the most recent diagnostic guidelines, risk factor reduction with self-care instructions and medication management.   CARDIAC REHAB PHASE II EXERCISE from 04/18/2018 in [place]  Date  04/13/18  Educator  RN  Instruction Review Code  2- Demonstrated Understanding       Nutrition I class: Heart Healthy Eating:  -Group instruction provided by PowerPoint slides, verbal discussion, and written materials to support subject matter. The instructor gives an explanation and review of the Therapeutic Lifestyle Changes diet recommendations, which includes a  discussion on lipid goals, dietary fat, sodium, fiber, plant stanol/sterol esters, sugar, and the components of a well-balanced, healthy diet.   Nutrition II class: Lifestyle Skills:  -Group instruction provided by PowerPoint slides, verbal discussion, and written materials to support subject matter. The instructor gives an explanation and review of label reading, grocery shopping for heart health, heart healthy recipe modifications, and ways to make healthier choices when eating out.   Diabetes Question & Answer:  -Group instruction provided by PowerPoint slides, verbal discussion, and written materials to support subject matter. The instructor gives an explanation and review of diabetes co-morbidities, pre- and post-prandial blood glucose goals, pre-exercise blood glucose goals, signs, symptoms, and treatment of hypoglycemia and hyperglycemia, and foot care basics.   Diabetes Blitz:  -Group instruction provided by PowerPoint slides, verbal discussion, and written materials to support subject matter. The instructor gives an explanation and review of the physiology behind type 1 and type 2 diabetes, diabetes medications and rational behind using different medications, pre- and post-prandial blood glucose recommendations and Hemoglobin A1c goals, diabetes diet, and exercise including blood glucose guidelines for exercising safely.    Portion Distortion:  -Group instruction provided by PowerPoint slides, verbal discussion, written materials, and food models to support subject matter. The instructor gives an explanation of serving size versus portion size, changes in portions sizes over the last 20 years, and what consists of a serving from each food group.   Stress Management:  -Group instruction provided by verbal instruction, video, and written materials to support subject matter.  Instructors review role of stress in heart disease and how to cope with stress positively.     CARDIAC REHAB PHASE  II EXERCISE from 04/18/2018 in [place]  Date  03/14/18  Educator  RN  Instruction Review Code  2- Demonstrated Understanding      Exercising on Your Own:  -Group instruction provided by verbal instruction, power point, and written materials to support subject.  Instructors discuss benefits of exercise, components of exercise, frequency and intensity of exercise, and end points for exercise.  Also discuss use of nitroglycerin and activating EMS.  Review options of places to exercise outside of rehab.  Review guidelines for sex with heart disease.   CARDIAC REHAB PHASE II EXERCISE from 04/18/2018 in [place]  Date  04/11/18  Educator  EP  Instruction Review Code  2- Demonstrated Understanding      Cardiac Drugs I:  -Group instruction provided by verbal instruction and written materials to support subject.  Instructor reviews cardiac  drug classes: antiplatelets, anticoagulants, beta blockers, and statins.  Instructor discusses reasons, side effects, and lifestyle considerations for each drug class.   Cardiac Drugs II:  -Group instruction provided by verbal instruction and written materials to support subject.  Instructor reviews cardiac drug classes: angiotensin converting enzyme inhibitors (ACE-I), angiotensin II receptor blockers (ARBs), nitrates, and calcium channel blockers.  Instructor discusses reasons, side effects, and lifestyle considerations for each drug class.   CARDIAC REHAB PHASE II EXERCISE from 04/18/2018 in [place]  Date  04/04/18  Instruction Review Code  2- Demonstrated Understanding      Anatomy and Physiology of the Circulatory System:  Group verbal and written instruction and models provide basic cardiac anatomy and physiology, with the coronary electrical and arterial systems. Review of: AMI, Angina, Valve disease, Heart Failure, Peripheral Artery Disease, Cardiac  Arrhythmia, Pacemakers, and the ICD.   CARDIAC REHAB PHASE II EXERCISE from 04/18/2018 in [place]  Date  03/21/18  Educator  RN  Instruction Review Code  2- Demonstrated Understanding      Other Education:  -Group or individual verbal, written, or video instructions that support the educational goals of the cardiac rehab program.   Holiday Eating Survival Tips:  -Group instruction provided by PowerPoint slides, verbal discussion, and written materials to support subject matter. The instructor gives patients tips, tricks, and techniques to help them not only survive but enjoy the holidays despite the onslaught of food that accompanies the holidays.   Knowledge Questionnaire Score: Knowledge Questionnaire Score - 02/27/18 1003      Knowledge Questionnaire Score   Pre Score  23/24       Core Components/Risk Factors/Patient Goals at Admission: Personal Goals and Risk Factors at Admission - 02/27/18 1147      Core Components/Risk Factors/Patient Goals on Admission    Weight Management  Yes;Obesity;Weight Maintenance;Weight Loss    Intervention  Weight Management: Develop a combined nutrition and exercise program designed to reach desired caloric intake, while maintaining appropriate intake of nutrient and fiber, sodium and fats, and appropriate energy expenditure required for the weight goal.;Weight Management: Provide education and appropriate resources to help participant work on and attain dietary goals.;Weight Management/Obesity: Establish reasonable short term and long term weight goals.;Obesity: Provide education and appropriate resources to help participant work on and attain dietary goals.    Admit Weight  179 lb 7.3 oz (81.4 kg)    Goal Weight: Short Term  169 lb (76.7 kg)    Goal Weight: Long Term  159 lb (72.1 kg)    Expected Outcomes  Short Term: Continue to assess and modify interventions until short term weight is achieved;Long Term:  Adherence to nutrition and physical activity/exercise program aimed toward attainment of established weight goal;Weight Maintenance: Understanding of the daily nutrition guidelines, which includes 25-35% calories from fat, 7% or less cal from saturated fats, less than 200mg  cholesterol, less than 1.5gm of sodium, & 5 or more servings of fruits and vegetables daily;Weight Loss: Understanding of general recommendations for a balanced deficit meal plan, which promotes 1-2 lb weight loss per week and includes a negative energy balance of [phone] kcal/d;Understanding recommendations for meals to include 15-35% energy as protein, 25-35% energy from fat, 35-60% energy from carbohydrates, less than 200mg  of dietary cholesterol, 20-35 gm of total fiber daily;Understanding of distribution of calorie intake throughout the day with the consumption of 4-5 meals/snacks    Diabetes  Yes    Intervention  Provide education  about signs/symptoms and action to take for hypo/hyperglycemia.;Provide education about proper nutrition, including hydration, and aerobic/resistive exercise prescription along with prescribed medications to achieve blood glucose in normal ranges: Fasting glucose 65-99 mg/dL    Expected Outcomes  Short Term: Participant verbalizes understanding of the signs/symptoms and immediate care of hyper/hypoglycemia, proper foot care and importance of medication, aerobic/resistive exercise and nutrition plan for blood glucose control.;Long Term: Attainment of HbA1C < 7%.    Hypertension  Yes    Intervention  Provide education on lifestyle modifcations including regular physical activity/exercise, weight management, moderate sodium restriction and increased consumption of fresh fruit, vegetables, and low fat dairy, alcohol moderation, and smoking cessation.;Monitor prescription use compliance.    Expected Outcomes  Short Term: Continued assessment and intervention until BP is < 140/4mm HG in hypertensive  participants. < 130/5mm HG in hypertensive participants with diabetes, heart failure or chronic kidney disease.;Long Term: Maintenance of blood pressure at goal levels.    Lipids  Yes    Intervention  Provide education and support for participant on nutrition & aerobic/resistive exercise along with prescribed medications to achieve LDL 70mg , HDL >40mg .    Expected Outcomes  Short Term: Participant states understanding of desired cholesterol values and is compliant with medications prescribed. Participant is following exercise prescription and nutrition guidelines.;Long Term: Cholesterol controlled with medications as prescribed, with individualized exercise RX and with personalized nutrition plan. Value goals: LDL < 70mg , HDL > 40 mg.    Stress  Yes    Intervention  Offer individual and/or small group education and counseling on adjustment to heart disease, stress management and health-related lifestyle change. Teach and support self-help strategies.;Refer participants experiencing significant psychosocial distress to appropriate mental health specialists for further evaluation and treatment. When possible, include family members and significant others in education/counseling sessions.    Expected Outcomes  Short Term: Participant demonstrates changes in health-related behavior, relaxation and other stress management skills, ability to obtain effective social support, and compliance with psychotropic medications if prescribed.;Long Term: Emotional wellbeing is indicated by absence of clinically significant psychosocial distress or social isolation.    Personal Goal Other  Yes    Personal Goal  Increase confidence with exercise and physical activity. Learn activity limitations.    Intervention  Provide cardiac education class on how to exercise on your own. Offer individual and group couseling on safety precautions with physical activity. Provide safe and effective exercise programming to increase  confidence and aerobic fitness    Expected Outcomes  Pt will be able to exercise safely in the community. Pt will increase confidence level with exercise and learn activity limitations.       Core Components/Risk Factors/Patient Goals Review:  Goals and Risk Factor Review    Row Name 03/12/18 0920 03/26/18 1652 04/20/18 1547         Core Components/Risk Factors/Patient Goals Review   Personal Goals Review  Weight Management/Obesity;Lipids;Hypertension;Stress;Diabetes  Weight Management/Obesity;Lipids;Hypertension;Stress;Diabetes  Weight Management/Obesity;Lipids;Hypertension;Stress;Diabetes     Review  Pt with multiple CAD RFs willinng to participate in CR exercise. Gladie would like to be unafraid of exercise and learn boundaries.   Pt with multiple CAD RFs willinng to participate in CR exercise. Arrington is increasing her comfort on the various machines and is tolerating exercise well.  She has been able to help her peers set up machines when they are new in the program.   Pt with multiple CAD RFs willinng to participate in CR exercise. Cinderella continues to do well with exercise.  She has  great attendance.      Expected Outcomes  Lakara will participate in CR exercse, nutrition, and lifestyle modification opportunities.   Kalkidan will participate in CR exercise, nutrition, and lifestyle modification opportunities.   Shermeka will participate in CR exercise, nutrition, and lifestyle modification opportunities.         Core Components/Risk Factors/Patient Goals at Discharge (Final Review):  Goals and Risk Factor Review - 04/20/18 1547      Core Components/Risk Factors/Patient Goals Review   Personal Goals Review  Weight Management/Obesity;Lipids;Hypertension;Stress;Diabetes    Review  Pt with multiple CAD RFs willinng to participate in CR exercise. Namiah continues to do well with exercise.  She has great attendance.     Expected Outcomes  Carrieann will participate in CR exercise, nutrition, and  lifestyle modification opportunities.        ITP Comments: ITP Comments    Row Name 02/23/18 1142 03/12/18 0751 03/26/18 0943 04/20/18 1546     ITP Comments  Dr. Fransico Him , Medical Director   Estill Bamberg started exercise today and tolerated it well.   30 Day ITP Review.  Simara is doing well with her recent start in the program.  She tolerating increases in workload well.  She is socializing and helping other participants.  30 Day ITP Review.  Tesia is continues to do well with exercise.  She has great attendance.       Comments: See ITP Comments.

## 2018-04-27 ENCOUNTER — Encounter (HOSPITAL_COMMUNITY)
Admission: RE | Admit: 2018-04-27 | Discharge: 2018-04-27 | Disposition: A | Discharge status: Home / Self Care | Payer: BLUE CROSS/BLUE SHIELD | Source: Ambulatory Visit | Attending: Cardiology | Admitting: Cardiology

## 2018-04-27 ENCOUNTER — Encounter (HOSPITAL_COMMUNITY): Payer: BLUE CROSS/BLUE SHIELD

## 2018-04-27 DIAGNOSIS — Z955 Presence of coronary angioplasty implant and graft: Secondary | ICD-10-CM | POA: Diagnosis not present

## 2018-04-30 ENCOUNTER — Encounter (HOSPITAL_COMMUNITY)
Admission: RE | Admit: 2018-04-30 | Discharge: 2018-04-30 | Disposition: A | Discharge status: Home / Self Care | Payer: BLUE CROSS/BLUE SHIELD | Source: Ambulatory Visit | Attending: Cardiology | Admitting: Cardiology

## 2018-04-30 ENCOUNTER — Encounter (HOSPITAL_COMMUNITY): Payer: BLUE CROSS/BLUE SHIELD

## 2018-04-30 DIAGNOSIS — Z955 Presence of coronary angioplasty implant and graft: Secondary | ICD-10-CM | POA: Diagnosis not present

## 2018-05-02 ENCOUNTER — Encounter (HOSPITAL_COMMUNITY): Payer: BLUE CROSS/BLUE SHIELD

## 2018-05-02 ENCOUNTER — Encounter (HOSPITAL_COMMUNITY)
Admission: RE | Admit: 2018-05-02 | Discharge: 2018-05-02 | Disposition: A | Discharge status: Home / Self Care | Payer: BLUE CROSS/BLUE SHIELD | Source: Ambulatory Visit | Attending: Cardiology | Admitting: Cardiology

## 2018-05-02 DIAGNOSIS — Z955 Presence of coronary angioplasty implant and graft: Secondary | ICD-10-CM

## 2018-05-04 ENCOUNTER — Encounter (HOSPITAL_COMMUNITY)
Admission: RE | Admit: 2018-05-04 | Discharge: 2018-05-04 | Disposition: A | Discharge status: Home / Self Care | Payer: BLUE CROSS/BLUE SHIELD | Source: Ambulatory Visit | Attending: Cardiology | Admitting: Cardiology

## 2018-05-04 ENCOUNTER — Encounter (HOSPITAL_COMMUNITY): Payer: BLUE CROSS/BLUE SHIELD

## 2018-05-04 DIAGNOSIS — Z955 Presence of coronary angioplasty implant and graft: Secondary | ICD-10-CM

## 2018-05-07 ENCOUNTER — Encounter (HOSPITAL_COMMUNITY): Payer: BLUE CROSS/BLUE SHIELD

## 2018-05-07 ENCOUNTER — Encounter (HOSPITAL_COMMUNITY)
Admission: RE | Admit: 2018-05-07 | Discharge: 2018-05-07 | Disposition: A | Discharge status: Home / Self Care | Payer: BLUE CROSS/BLUE SHIELD | Source: Ambulatory Visit | Attending: Cardiology | Admitting: Cardiology

## 2018-05-07 DIAGNOSIS — Z955 Presence of coronary angioplasty implant and graft: Secondary | ICD-10-CM | POA: Diagnosis not present

## 2018-05-09 ENCOUNTER — Encounter (HOSPITAL_COMMUNITY): Payer: BLUE CROSS/BLUE SHIELD

## 2018-05-09 ENCOUNTER — Encounter (HOSPITAL_COMMUNITY)
Admission: RE | Admit: 2018-05-09 | Discharge: 2018-05-09 | Disposition: A | Discharge status: Home / Self Care | Payer: BLUE CROSS/BLUE SHIELD | Source: Ambulatory Visit | Attending: Cardiology | Admitting: Cardiology

## 2018-05-09 DIAGNOSIS — Z955 Presence of coronary angioplasty implant and graft: Secondary | ICD-10-CM | POA: Diagnosis not present

## 2018-05-11 ENCOUNTER — Encounter (HOSPITAL_COMMUNITY): Payer: BLUE CROSS/BLUE SHIELD

## 2018-05-11 ENCOUNTER — Encounter (HOSPITAL_COMMUNITY)
Admission: RE | Admit: 2018-05-11 | Discharge: 2018-05-11 | Disposition: A | Discharge status: Home / Self Care | Payer: BLUE CROSS/BLUE SHIELD | Source: Ambulatory Visit | Attending: Cardiology | Admitting: Cardiology

## 2018-05-11 DIAGNOSIS — Z955 Presence of coronary angioplasty implant and graft: Secondary | ICD-10-CM | POA: Diagnosis not present

## 2018-05-14 ENCOUNTER — Encounter (HOSPITAL_COMMUNITY): Payer: BLUE CROSS/BLUE SHIELD

## 2018-05-14 ENCOUNTER — Encounter (HOSPITAL_COMMUNITY)
Admission: RE | Admit: 2018-05-14 | Discharge: 2018-05-14 | Disposition: A | Discharge status: Home / Self Care | Payer: BLUE CROSS/BLUE SHIELD | Source: Ambulatory Visit | Attending: Cardiology | Admitting: Cardiology

## 2018-05-14 DIAGNOSIS — Z955 Presence of coronary angioplasty implant and graft: Secondary | ICD-10-CM | POA: Diagnosis not present

## 2018-05-16 ENCOUNTER — Encounter (HOSPITAL_COMMUNITY)
Admission: RE | Admit: 2018-05-16 | Discharge: 2018-05-16 | Disposition: A | Discharge status: Home / Self Care | Payer: BLUE CROSS/BLUE SHIELD | Source: Ambulatory Visit | Attending: Cardiology | Admitting: Cardiology

## 2018-05-16 ENCOUNTER — Encounter (HOSPITAL_COMMUNITY): Payer: BLUE CROSS/BLUE SHIELD

## 2018-05-16 DIAGNOSIS — Z955 Presence of coronary angioplasty implant and graft: Secondary | ICD-10-CM

## 2018-05-18 ENCOUNTER — Encounter (HOSPITAL_COMMUNITY)
Admission: RE | Admit: 2018-05-18 | Discharge: 2018-05-18 | Disposition: A | Discharge status: Home / Self Care | Payer: BLUE CROSS/BLUE SHIELD | Source: Ambulatory Visit | Attending: Cardiology | Admitting: Cardiology

## 2018-05-18 ENCOUNTER — Encounter (HOSPITAL_COMMUNITY): Payer: BLUE CROSS/BLUE SHIELD

## 2018-05-18 DIAGNOSIS — Z955 Presence of coronary angioplasty implant and graft: Secondary | ICD-10-CM

## 2018-05-21 ENCOUNTER — Encounter (HOSPITAL_COMMUNITY): Payer: BLUE CROSS/BLUE SHIELD

## 2018-05-21 ENCOUNTER — Encounter (HOSPITAL_COMMUNITY)
Admission: RE | Admit: 2018-05-21 | Discharge: 2018-05-21 | Disposition: A | Discharge status: Home / Self Care | Payer: BLUE CROSS/BLUE SHIELD | Source: Ambulatory Visit | Attending: Cardiology | Admitting: Cardiology

## 2018-05-21 DIAGNOSIS — Z955 Presence of coronary angioplasty implant and graft: Secondary | ICD-10-CM

## 2018-05-23 ENCOUNTER — Encounter (HOSPITAL_COMMUNITY): Payer: BLUE CROSS/BLUE SHIELD

## 2018-05-23 ENCOUNTER — Encounter (HOSPITAL_COMMUNITY)
Admission: RE | Admit: 2018-05-23 | Discharge: 2018-05-23 | Disposition: A | Discharge status: Home / Self Care | Payer: BLUE CROSS/BLUE SHIELD | Source: Ambulatory Visit | Attending: Cardiology | Admitting: Cardiology

## 2018-05-23 ENCOUNTER — Encounter (HOSPITAL_COMMUNITY): Payer: Self-pay

## 2018-05-23 VITALS — Ht 61.0 in | Wt 182.8 lb

## 2018-05-23 DIAGNOSIS — Z955 Presence of coronary angioplasty implant and graft: Secondary | ICD-10-CM | POA: Insufficient documentation

## 2018-05-24 DIAGNOSIS — F341 Dysthymic disorder: Secondary | ICD-10-CM | POA: Diagnosis not present

## 2018-05-24 DIAGNOSIS — F4010 Social phobia, unspecified: Secondary | ICD-10-CM | POA: Diagnosis not present

## 2018-05-24 NOTE — Progress Notes (Signed)
Cardiac Individual Treatment Plan  Patient Details  Name: ERMIE GLENDENNING MRN: [id number] Date of Birth: 02/20/1956 Referring Provider:     CARDIAC REHAB PHASE II ORIENTATION from 02/27/2018 in [place]  Referring Provider  Candee Furbish MD      Initial Encounter Date:    CARDIAC REHAB PHASE II ORIENTATION from 02/27/2018 in [place]  Date  02/27/18      Visit Diagnosis: Stented coronary artery 12/20/17 DES RCA ,LAD  Patient's Home Medications on Admission:  Current Outpatient Medications:  .  aspirin EC 81 MG tablet, Take 81 mg by mouth at bedtime., Disp: , Rfl:  .  carvedilol (COREG) 3.125 MG tablet, Take 1 tablet (3.125 mg total) by mouth 2 (two) times daily., Disp: 180 tablet, Rfl: 3 .  ezetimibe (ZETIA) 10 MG tablet, Take 10 mg by mouth daily. , Disp: , Rfl: 1 .  glipiZIDE (GLUCOTROL XL) 5 MG 24 hr tablet, Take 5 mg by mouth daily., Disp: , Rfl: 0 .  lisinopril (PRINIVIL,ZESTRIL) 10 MG tablet, Take 10 mg by mouth daily., Disp: , Rfl:  .  nitroGLYCERIN (NITROSTAT) 0.4 MG SL tablet, Place 1 tablet (0.4 mg total) under the tongue every 5 (five) minutes as needed for chest pain., Disp: 25 tablet, Rfl: 4 .  Polyethyl Glycol-Propyl Glycol (SYSTANE) 0.4-0.3 % SOLN, Apply 1 drop to eye 2 (two) times a week., Disp: , Rfl:  .  rosuvastatin (CRESTOR) 10 MG tablet, Take 1 tablet three times weekly and increase as tolerated., Disp: 30 tablet, Rfl: 11 .  ticagrelor (BRILINTA) 90 MG TABS tablet, Take 1 tablet (90 mg total) by mouth 2 (two) times daily., Disp: 60 tablet, Rfl: 11  Past Medical History: Past Medical History:  Diagnosis Date  . Allergic rhinitis   . Anemia   . Anginal pain ([place])   . Arthritis   . Coronary artery disease    12/20/17- DES to mid RCA & LAD. other non-obstructive disease.   . Crohn disease ([place])   . Depression   . Diabetes mellitus without complication ([place])   . Family history of heart disease   .  GERD (gastroesophageal reflux disease)   . Hyperlipidemia   . Hypertension   . Osteopenia   . Sleep apnea   . Vitamin D deficiency     Tobacco Use: Social History   Tobacco Use  Smoking Status Former Smoker  . Types: Cigarettes  . Last attempt to quit: 06/23/1975  . Years since quitting: 42.9  Smokeless Tobacco Never Used  Tobacco Comment   SMOKED BRIEFLY IN EARLY 20'S    Labs: Recent Review Flowsheet Data    Labs for ITP Cardiac and Pulmonary Rehab Latest Ref Rng & Units 01/04/2007 07/23/2007 07/15/2008 05/17/2013 04/04/2018   Cholestrol 100 - 199 mg/dL 171 179 167 - 149   LDLCALC 0 - 99 mg/dL 95 103(H) 93 - 72   HDL >39 mg/dL 43.1 44.7 39.2 - 50   Trlycerides 0 - 149 mg/dL 164(H) 157(H) 174(H) - 136   TCO2 0 - 100 mmol/L - - - 26 -      Capillary Blood Glucose: Lab Results  Component Value Date   GLUCAP 119 (H) 12/21/2017   GLUCAP 95 12/20/2017   GLUCAP 112 (H) 12/20/2017   GLUCAP 103 (H) 12/20/2017     Exercise Target Goals: Exercise Program Goal: Individual exercise prescription set using results from initial 6 min walk test and THRR while considering  patient's activity barriers and safety.   Exercise Prescription Goal: Initial exercise prescription builds to 30-45 minutes a day of aerobic activity, 2-3 days per week.  Home exercise guidelines will be given to patient during program as part of exercise prescription that the participant will acknowledge.  Activity Barriers & Risk Stratification: Activity Barriers & Cardiac Risk Stratification - 02/27/18 1029      Activity Barriers & Cardiac Risk Stratification   Activity Barriers  Deconditioning;Muscular Weakness;Joint Problems;Other (comment)    Comments  myalgia and R knee discomfort    Cardiac Risk Stratification  Moderate       6 Minute Walk: 6 Minute Walk    Row Name 02/27/18 1009 02/27/18 1108 05/23/18 1444     6 Minute Walk   Phase  Initial  -  Discharge   Distance  1675 feet  -  1813 feet    Distance % Change  -  -  8.24 %   Distance Feet Change  -  -  138 ft   Walk Time  6 minutes  -  6 minutes   # of Rest Breaks  0  -  0   MPH  3.2  -  3.43   METS  3.4  -  4.05   RPE  12  -  12   Perceived Dyspnea   -  -  0   VO2 Peak  12.1  -  14.17   Symptoms  Yes (comment)  -  No   Comments  twinge in chest region, L-sided  -  -   Resting HR  68 bpm  -  70 bpm   Resting BP  128/68  -  148/78   Resting Oxygen Saturation   98 %  -  -   Exercise Oxygen Saturation  during 6 min walk  98 %  -  -   Max Ex. HR  93 bpm  -  117 bpm   Max Ex. BP  140/80  -  156/80   2 Minute Post BP  -  122/68  130/76      Oxygen Initial Assessment:   Oxygen Re-Evaluation:   Oxygen Discharge (Final Oxygen Re-Evaluation):   Initial Exercise Prescription: Initial Exercise Prescription - 02/27/18 1000      Date of Initial Exercise RX and Referring Provider   Date  02/27/18    Referring Provider  Candee Furbish MD    Expected Discharge Date  06/04/18      Treadmill   MPH  3    Grade  0    Minutes  10    METs  3.3      Bike   Level  0.7    Minutes  10    METs  2.65      NuStep   Level  3    SPM  80    Minutes  10    METs  2.5      Prescription Details   Frequency (times per week)  3    Duration  Progress to 30 minutes of continuous aerobic without signs/symptoms of physical distress      Intensity   THRR 40-80% of Max Heartrate  62-125    Ratings of Perceived Exertion  11-13    Perceived Dyspnea  0-4      Progression   Progression  Continue to progress workloads to maintain intensity without signs/symptoms of physical distress.      Horticulturist, commercial Prescription  Yes    Weight  3lbs    Reps  10-15       Perform Capillary Blood Glucose checks as needed.  Exercise Prescription Changes: Exercise Prescription Changes    Row Name 03/12/18 1000 03/26/18 0805 04/11/18 0800 04/20/18 1419 05/09/18 0800     Response to Exercise   Blood Pressure (Admit)  160/80  Recheck: 148/86  132/72 Recheck: 148/86  140/70  120/74  136/80   Blood Pressure (Exercise)  148/92  142/80  164/78  150/80  182/78   Blood Pressure (Exit)  130/82  130/72  120/70  120/70  120/78   Heart Rate (Admit)  73 bpm  69 bpm  72 bpm  78 bpm  71 bpm   Heart Rate (Exercise)  102 bpm  120 bpm  126 bpm  116 bpm  121 bpm   Heart Rate (Exit)  72 bpm  73 bpm  72 bpm  84 bpm  70 bpm   Rating of Perceived Exertion (Exercise)  12  13  12  11  12    Perceived Dyspnea (Exercise)  0  0  0  0  0   Symptoms  Elevated BP   None   None   None   None   Comments  Pt oriented to exercise equipment  -  -  Added Recumbent Bike in place of Airdyne bike per pt's request   Pt hypertensive on Nustep   Duration  Progress to 30 minutes of  aerobic without signs/symptoms of physical distress  Continue with 30 min of aerobic exercise without signs/symptoms of physical distress.  Continue with 30 min of aerobic exercise without signs/symptoms of physical distress.  Continue with 30 min of aerobic exercise without signs/symptoms of physical distress.  Progress to 45 minutes of aerobic exercise without signs/symptoms of physical distress   Intensity  THRR New  THRR unchanged  THRR unchanged  THRR unchanged  THRR unchanged     Progression   Progression  Continue to progress workloads to maintain intensity without signs/symptoms of physical distress.  Continue to progress workloads to maintain intensity without signs/symptoms of physical distress.  Continue to progress workloads to maintain intensity without signs/symptoms of physical distress.  Continue to progress workloads to maintain intensity without signs/symptoms of physical distress.  Continue to progress workloads to maintain intensity without signs/symptoms of physical distress.   Average METs  2.63  2.9  3.95  3.9  4.16     Resistance Training   Training Prescription  Yes  Yes  No  Yes  No   Weight  3lbs  3lbs  -  4lbs  -   Reps  10-15  10-15  -  10-15  -   Time   10 Minutes  10 Minutes  -  10 Minutes  -     Interval Training   Interval Training  No  No  No  No  No     Treadmill   MPH  3  3  3   3.2  3.2   Grade  0  0  0  2  2   Minutes  10  10  10  10  10    METs  3.3  3.3  3.3  4.33  4.33     Bike   Level  0.7  1.7  1.7  -  -   Minutes  10  10  10   -  -   METs  2.62  2.61  4.64  -  -  Recumbant Bike   Level  -  -  -  2  4   Minutes  -  -  -  10  10   METs  -  -  -  3.4  4.56     NuStep   Level  3  4  5  5  5    SPM  80  105  110  110  110   Minutes  10  10  10  10  10    METs  1.98  2.8  4.1  4.1  3.6     Home Exercise Plan   Plans to continue exercise at  -  Home (comment) Treadmill @ Home   Home (comment) Treadmill @ Home   Home (comment) Treadmill @ Home   Home (comment) Treadmill @ Home   Frequency  -  Add 2 additional days to program exercise sessions.  Add 2 additional days to program exercise sessions.  Add 2 additional days to program exercise sessions.  Add 2 additional days to program exercise sessions.   Initial Home Exercises Provided  -  03/21/18  03/21/18  03/21/18  03/21/18   Row Name 05/23/18 1600             Response to Exercise   Blood Pressure (Admit)  152/74       Blood Pressure (Exercise)  170/80       Blood Pressure (Exit)  142/80       Heart Rate (Admit)  64 bpm       Heart Rate (Exercise)  113 bpm       Heart Rate (Exit)  65 bpm       Rating of Perceived Exertion (Exercise)  12       Perceived Dyspnea (Exercise)  0       Symptoms  None       Comments  Pt hypertensive        Duration  Continue with 45 min of aerobic exercise without signs/symptoms of physical distress.       Intensity  THRR unchanged         Progression   Progression  Continue to progress workloads to maintain intensity without signs/symptoms of physical distress.       Average METs  4.12         Resistance Training   Training Prescription  No         Interval Training   Interval Training  No         Recumbant Bike   Level   4       Minutes  10       METs  4.22         NuStep   Level  5       SPM  110       Minutes  10       METs  3.8         Track   Laps  12       Minutes  10       METs  3.07         Home Exercise Plan   Plans to continue exercise at  Home (comment) Treadmill @ Home       Frequency  Add 2 additional days to program exercise sessions.       Initial Home Exercises Provided  03/21/18          Exercise Comments: Exercise Comments  [place] Name 03/12/18 1034 03/21/18 0715 04/25/18 1422 05/11/18 1517     Exercise Comments  Pt 's first day of exericse. Pt was oriented to exercise equipment. Pt responded well to exericse prescription. Will continue to monitor and progress pt.   Reviewed home exercise guidelines with patient.  Reviewed METs and Goals with pt. Pt is responding well to exercise prescriptions and puts forth great effort with exercise. Will continue to monitor and progress pt as tolerated.   Reviewed METs and Goals with pt.        Exercise Goals and Review: Exercise Goals    Row Name 02/27/18 0816             Exercise Goals   Increase Physical Activity  Yes       Intervention  Provide advice, education, support and counseling about physical activity/exercise needs.;Develop an individualized exercise prescription for aerobic and resistive training based on initial evaluation findings, risk stratification, comorbidities and participant's personal goals.       Expected Outcomes  Short Term: Attend rehab on a regular basis to increase amount of physical activity.;Long Term: Add in home exercise to make exercise part of routine and to increase amount of physical activity.;Long Term: Exercising regularly at least 3-5 days a week.       Increase Strength and Stamina  Yes       Intervention  Provide advice, education, support and counseling about physical activity/exercise needs.;Develop an individualized exercise prescription for aerobic and resistive training based on initial  evaluation findings, risk stratification, comorbidities and participant's personal goals.       Expected Outcomes  Short Term: Increase workloads from initial exercise prescription for resistance, speed, and METs.;Short Term: Perform resistance training exercises routinely during rehab and add in resistance training at home;Long Term: Improve cardiorespiratory fitness, muscular endurance and strength as measured by increased METs and functional capacity (6MWT)       Able to understand and use rate of perceived exertion (RPE) scale  Yes       Intervention  Provide education and explanation on how to use RPE scale       Expected Outcomes  Short Term: Able to use RPE daily in rehab to express subjective intensity level;Long Term:  Able to use RPE to guide intensity level when exercising independently       Knowledge and understanding of Target Heart Rate Range (THRR)  Yes       Intervention  Provide education and explanation of THRR including how the numbers were predicted and where they are located for reference       Expected Outcomes  Short Term: Able to state/look up THRR;Short Term: Able to use daily as guideline for intensity in rehab;Long Term: Able to use THRR to govern intensity when exercising independently       Able to check pulse independently  Yes       Intervention  Provide education and demonstration on how to check pulse in carotid and radial arteries.;Review the importance of being able to check your own pulse for safety during independent exercise       Expected Outcomes  Short Term: Able to explain why pulse checking is important during independent exercise;Long Term: Able to check pulse independently and accurately       Understanding of Exercise Prescription  Yes       Intervention  Provide education, explanation, and written materials on patient's individual exercise prescription       Expected Outcomes  Short  Term: Able to explain program exercise prescription;Long Term: Able to  explain home exercise prescription to exercise independently          Exercise Goals Re-Evaluation : Exercise Goals Re-Evaluation    Row Name 03/21/18 0715 04/25/18 1431 05/11/18 1518         Exercise Goal Re-Evaluation   Exercise Goals Review  Understanding of Exercise Prescription;Increase Physical Activity;Knowledge and understanding of Target Heart Rate Range (THRR);Able to understand and use rate of perceived exertion (RPE) scale  Increase Physical Activity;Understanding of Exercise Prescription  Increase Physical Activity;Understanding of Exercise Prescription     Comments  Reviewed home exercise guidelines with patient including THRR, RPE scale and endpoints for exercise. Pt has a treadmill at home, which she plans to use as her mode of home exercise.  Pt has not started exercising at home yet. One of pt's goal is to lose weight. Discussed with pt the importance of exercising at home and diet. Pt states she has a hard time after work, exercising.   Followed up with pt regarding exercising at home. Pt is still not exerising at home yet. Encouraged pt again to try and add 1 more day of exercise. Pt has treadmill at home. Pt does want to try rower machine. Will orient pt to new machine next week.      Expected Outcomes  Pt will walk on home treadmill 30-40 minutes, at least 2 days/week in addition to exercise at cardiac rehab.  Pt will plan to walk at home 1 day at home on treadmill. Pt will start with one day and eventually add an additional day once exercising at home once a week consistently. Will continue to monitor pt's progress with exercising at home.   Pt will try to add 1 exercise day at home using treadmill. Pt is struggling to exercise on her own. Pt will continue to build stamina and cardiovascular strength. Will continue to work with pt regarding home exercise.         Discharge Exercise Prescription (Final Exercise Prescription Changes): Exercise Prescription Changes - 05/23/18  1600      Response to Exercise   Blood Pressure (Admit)  152/74    Blood Pressure (Exercise)  170/80    Blood Pressure (Exit)  142/80    Heart Rate (Admit)  64 bpm    Heart Rate (Exercise)  113 bpm    Heart Rate (Exit)  65 bpm    Rating of Perceived Exertion (Exercise)  12    Perceived Dyspnea (Exercise)  0    Symptoms  None    Comments  Pt hypertensive     Duration  Continue with 45 min of aerobic exercise without signs/symptoms of physical distress.    Intensity  THRR unchanged      Progression   Progression  Continue to progress workloads to maintain intensity without signs/symptoms of physical distress.    Average METs  4.12      Resistance Training   Training Prescription  No      Interval Training   Interval Training  No      Recumbant Bike   Level  4    Minutes  10    METs  4.22      NuStep   Level  5    SPM  110    Minutes  10    METs  3.8      Track   Laps  12    Minutes  10  METs  3.07      Home Exercise Plan   Plans to continue exercise at  Home (comment)   Treadmill @ Home   Frequency  Add 2 additional days to program exercise sessions.    Initial Home Exercises Provided  03/21/18       Nutrition:  Target Goals: Understanding of nutrition guidelines, daily intake of sodium 1500mg , cholesterol 200mg , calories 30% from fat and 7% or less from saturated fats, daily to have 5 or more servings of fruits and vegetables.  Biometrics: Pre Biometrics - 02/27/18 1026      Pre Biometrics   Height  5\' 1"  (1.549 m)    Weight  81.4 kg    Waist Circumference  40.5 inches    Hip Circumference  46 inches    Waist to Hip Ratio  0.88 %    BMI (Calculated)  33.93    Triceps Skinfold  33 mm    % Body Fat  45.5 %    Grip Strength  27 kg    Flexibility  15 in    Single Leg Stand  30 seconds      Post Biometrics - 05/23/18 1444       Post  Biometrics   Height  5\' 1"  (1.549 m)    Weight  82.9 kg    Waist Circumference  38 inches    Hip Circumference   43.5 inches    Waist to Hip Ratio  0.87 %    BMI (Calculated)  34.55    Triceps Skinfold  27 mm    % Body Fat  43.8 %    Grip Strength  30 kg    Flexibility  16 in    Single Leg Stand  34 seconds       Nutrition Therapy Plan and Nutrition Goals: Nutrition Therapy & Goals - 03/14/18 0810      Nutrition Therapy   Diet  consistent carbohydrate heart healthy      Personal Nutrition Goals   Nutrition Goal  Pt to identify food quantities necessary to achieve weight loss of 6-24 lbs. at graduation from cardiac rehab. Goal wt of 20 lb desired.     Personal Goal #2  Pt to identify and limit food sources of saturated fat, trans fat, and sodium      Intervention Plan   Intervention  Prescribe, educate and counsel regarding individualized specific dietary modifications aiming towards targeted core components such as weight, hypertension, lipid management, diabetes, heart failure and other comorbidities.    Expected Outcomes  Short Term Goal: Understand basic principles of dietary content, such as calories, fat, sodium, cholesterol and nutrients.       Nutrition Assessments: Nutrition Assessments - 02/27/18 0858      MEDFICTS Scores   Pre Score  47       Nutrition Goals Re-Evaluation:   Nutrition Goals Re-Evaluation:   Nutrition Goals Discharge (Final Nutrition Goals Re-Evaluation):   Psychosocial: Target Goals: Acknowledge presence or absence of significant depression and/or stress, maximize coping skills, provide positive support system. Participant is able to verbalize types and ability to use techniques and skills needed for reducing stress and depression.  Initial Review & Psychosocial Screening: Initial Psych Review & Screening - 02/27/18 1121      Initial Review   Current issues with  History of Depression   Latishia has a history of depression and sees a therapist on a regular basis     Family Dynamics   Good Support  System?  Yes   Angelyne has her counselor for support    Comments  Katelynne recently lost her dog of 15 years      Barriers   Psychosocial barriers to participate in program  The patient should benefit from training in stress management and relaxation.;Psychosocial barriers identified (see note)      Screening Interventions   Interventions  Encouraged to exercise;To provide support and resources with identified psychosocial needs;Provide feedback about the scores to participant    Expected Outcomes  Short Term goal: Utilizing psychosocial counselor, staff and physician to assist with identification of specific Stressors or current issues interfering with healing process. Setting desired goal for each stressor or current issue identified.;Long Term Goal: Stressors or current issues are controlled or eliminated.;Short Term goal: Identification and review with participant of any Quality of Life or Depression concerns found by scoring the questionnaire.;Long Term goal: The participant improves quality of Life and PHQ9 Scores as seen by post scores and/or verbalization of changes       Quality of Life Scores: Quality of Life - 02/27/18 1032      Quality of Life   Select  Quality of Life      Quality of Life Scores   Health/Function Pre  14.75 %    Socioeconomic Pre  17.43 %    Psych/Spiritual Pre  10.5 %    Family Pre  12.13 %    GLOBAL Pre  14.19 %      Scores of 19 and below usually indicate a poorer quality of life in these areas.  A difference of  2-3 points is a clinically meaningful difference.  A difference of 2-3 points in the total score of the Quality of Life Index has been associated with significant improvement in overall quality of life, self-image, physical symptoms, and general health in studies assessing change in quality of life.  PHQ-9: Recent Review Flowsheet Data    Depression screen [facility] 2/9 03/12/2018   Decreased Interest 0   Down, Depressed, Hopeless 1   PHQ - 2 Score 1     Interpretation of Total Score  Total Score  Depression Severity:  1-4 = Minimal depression, 5-9 = Mild depression, 10-14 = Moderate depression, 15-19 = Moderately severe depression, 20-27 = Severe depression   Psychosocial Evaluation and Intervention: Psychosocial Evaluation - 03/12/18 0924      Psychosocial Evaluation & Interventions   Interventions  Encouraged to exercise with the program and follow exercise prescription;Stress management education;Relaxation education    Comments  Keylie has a history of depression.  Some days she has "episodes" of depression.  Does not currently have a therapist or have a desire to see representative from [facility].   Ellinore enjoys reading, walking, and watching TV.     Expected Outcomes  Mikeala will endorse a positive outlook with less reported days of depression.     Continue Psychosocial Services   Follow up required by staff       Psychosocial Re-Evaluation: Psychosocial Re-Evaluation    [place] Name 03/26/18 2778 04/20/18 1547 05/23/18 1729         Psychosocial Re-Evaluation   Current issues with  History of Depression  History of Depression  History of Depression     Comments  No psychosocial needs identified at this time.  Noela declined meeting with the spiritual care department.  No psychosocial needs identified at this time.    No psychosocial needs identified at this time.  Krisandra reports her depression  as stable.      Expected Outcomes  Annisten will continue to maintain a positive outlook with good coping skills.  Retaj will continue to maintain a positive outlook with good coping skills.  Jeana will continue to maintain a positive outlook with good coping skills.     Interventions  Encouraged to attend Cardiac Rehabilitation for the exercise;Relaxation education  Encouraged to attend Cardiac Rehabilitation for the exercise;Relaxation education  Encouraged to attend Cardiac Rehabilitation for the exercise;Relaxation education     Continue Psychosocial Services   Follow up  required by staff  Follow up required by staff  Follow up required by staff        Psychosocial Discharge (Final Psychosocial Re-Evaluation): Psychosocial Re-Evaluation - 05/23/18 1729      Psychosocial Re-Evaluation   Current issues with  History of Depression    Comments  No psychosocial needs identified at this time.  Alyxis reports her depression as stable.     Expected Outcomes  Neriyah will continue to maintain a positive outlook with good coping skills.    Interventions  Encouraged to attend Cardiac Rehabilitation for the exercise;Relaxation education    Continue Psychosocial Services   Follow up required by staff       Vocational Rehabilitation: Provide vocational rehab assistance to qualifying candidates.   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 Participant will state understanding/return demonstration of topics presented.  Learning Barriers/Preferences: Learning Barriers/Preferences - 02/27/18 0816      Learning Barriers/Preferences   Learning Barriers  Sight    Learning Preferences  Audio;Video;Verbal Instruction       Education Topics: Count Your Pulse:  -Group instruction provided by verbal instruction, demonstration, patient participation and written materials to support subject.  Instructors address importance of being able to find your pulse and how to count your pulse when at home without a heart monitor.  Patients get hands on experience counting their pulse with staff help and individually.   CARDIAC REHAB PHASE II EXERCISE from 05/16/2018 in [place]  Date  03/30/18  Educator  RN   Instruction Review Code  2- Demonstrated Understanding      Heart Attack, Angina, and Risk Factor Modification:  -Group instruction provided by verbal instruction, video, and written materials to support subject.  Instructors address signs and symptoms of angina and heart attacks.    Also discuss risk factors for heart disease and how to make changes to improve heart health risk factors.   CARDIAC REHAB PHASE II EXERCISE from 05/16/2018 in [place]  Date  03/28/18  Educator  RN  Instruction Review Code  2- Demonstrated Understanding      Functional Fitness:  -Group instruction provided by verbal instruction, demonstration, patient participation, and written materials to support subject.  Instructors address safety measures for doing things around the house.  Discuss how to get up and down off the floor, how to pick things up properly, how to safely get out of a chair without assistance, and balance training.   CARDIAC REHAB PHASE II EXERCISE from 05/16/2018 in [place]  Date  04/06/18  Instruction Review Code  2- Demonstrated Understanding      Meditation and Mindfulness:  -Group instruction provided by verbal instruction, patient participation, and written materials to support subject.  Instructor addresses importance of mindfulness and meditation practice to help reduce stress and improve awareness.  Instructor also leads participants through a meditation exercise.    CARDIAC REHAB PHASE II EXERCISE from 05/16/2018 in [place]  Date  04/18/18  Educator  Jeanella Craze  Instruction Review Code  2- Demonstrated Understanding      Stretching for Flexibility and Mobility:  -Group instruction provided by verbal instruction, patient participation, and written materials to support subject.  Instructors lead participants through series of stretches that are designed to increase flexibility thus  improving mobility.  These stretches are additional exercise for major muscle groups that are typically performed during regular warm up and cool down.   Hands Only CPR:  -Group verbal, video, and participation provides a basic overview of AHA guidelines for community CPR. Role-play of emergencies allow participants the opportunity to practice calling for help and chest compression technique with discussion of AED use.   Hypertension: -Group verbal and written instruction that provides a basic overview of hypertension including the most recent diagnostic guidelines, risk factor reduction with self-care instructions and medication management.   CARDIAC REHAB PHASE II EXERCISE from 05/16/2018 in [place]  Date  04/13/18  Educator  RN  Instruction Review Code  2- Demonstrated Understanding       Nutrition I class: Heart Healthy Eating:  -Group instruction provided by PowerPoint slides, verbal discussion, and written materials to support subject matter. The instructor gives an explanation and review of the Therapeutic Lifestyle Changes diet recommendations, which includes a discussion on lipid goals, dietary fat, sodium, fiber, plant stanol/sterol esters, sugar, and the components of a well-balanced, healthy diet.   Nutrition II class: Lifestyle Skills:  -Group instruction provided by PowerPoint slides, verbal discussion, and written materials to support subject matter. The instructor gives an explanation and review of label reading, grocery shopping for heart health, heart healthy recipe modifications, and ways to make healthier choices when eating out.   Diabetes Question & Answer:  -Group instruction provided by PowerPoint slides, verbal discussion, and written materials to support subject matter. The instructor gives an explanation and review of diabetes co-morbidities, pre- and post-prandial blood glucose goals, pre-exercise blood glucose goals, signs,  symptoms, and treatment of hypoglycemia and hyperglycemia, and foot care basics.   Diabetes Blitz:  -Group instruction provided by PowerPoint slides, verbal discussion, and written materials to support subject matter. The instructor gives an explanation and review of the physiology behind type 1 and type 2 diabetes, diabetes medications and rational behind using different medications, pre- and post-prandial blood glucose recommendations and Hemoglobin A1c goals, diabetes diet, and exercise including blood glucose guidelines for exercising safely.    Portion Distortion:  -Group instruction provided by PowerPoint slides, verbal discussion, written materials, and food models to support subject matter. The instructor gives an explanation of serving size versus portion size, changes in portions sizes over the last 20 years, and what consists of a serving from each food group.   Stress Management:  -Group instruction provided by verbal instruction, video, and written materials to support subject matter.  Instructors review role of stress in heart disease and how to cope with stress positively.     CARDIAC REHAB PHASE II EXERCISE from 05/16/2018 in [facility]  CARDIAC REHAB  Date  03/14/18  Educator  RN  Instruction Review Code  2- Demonstrated Understanding      Exercising on Your Own:  -Group instruction provided by verbal instruction, power point, and written materials to support subject.  Instructors discuss benefits of exercise, components of exercise, frequency and intensity of exercise, and end points for exercise.  Also discuss use of nitroglycerin and activating EMS.  Review options of places to exercise outside of rehab.  Review guidelines for sex with heart disease.   CARDIAC REHAB PHASE II EXERCISE from 05/16/2018 in [place]  Date  04/11/18  Educator  EP  Instruction Review Code  2- Demonstrated Understanding      Cardiac Drugs I:   -Group instruction provided by verbal instruction and written materials to support subject.  Instructor reviews cardiac drug classes: antiplatelets, anticoagulants, beta blockers, and statins.  Instructor discusses reasons, side effects, and lifestyle considerations for each drug class.   CARDIAC REHAB PHASE II EXERCISE from 05/16/2018 in [place]  Date  05/02/18  Instruction Review Code  2- Demonstrated Understanding      Cardiac Drugs II:  -Group instruction provided by verbal instruction and written materials to support subject.  Instructor reviews cardiac drug classes: angiotensin converting enzyme inhibitors (ACE-I), angiotensin II receptor blockers (ARBs), nitrates, and calcium channel blockers.  Instructor discusses reasons, side effects, and lifestyle considerations for each drug class.   CARDIAC REHAB PHASE II EXERCISE from 05/16/2018 in [place]  Date  04/04/18  Instruction Review Code  2- Demonstrated Understanding      Anatomy and Physiology of the Circulatory System:  Group verbal and written instruction and models provide basic cardiac anatomy and physiology, with the coronary electrical and arterial systems. Review of: AMI, Angina, Valve disease, Heart Failure, Peripheral Artery Disease, Cardiac Arrhythmia, Pacemakers, and the ICD.   CARDIAC REHAB PHASE II EXERCISE from 05/16/2018 in [place]  Date  05/16/18  Educator  RN  Instruction Review Code  2- Demonstrated Understanding      Other Education:  -Group or individual verbal, written, or video instructions that support the educational goals of the cardiac rehab program.   Holiday Eating Survival Tips:  -Group instruction provided by PowerPoint slides, verbal discussion, and written materials to support subject matter. The instructor gives patients tips, tricks, and techniques to help them not only survive but enjoy the  holidays despite the onslaught of food that accompanies the holidays.   Knowledge Questionnaire Score: Knowledge Questionnaire Score - 02/27/18 1003      Knowledge Questionnaire Score   Pre Score  23/24       Core Components/Risk Factors/Patient Goals at Admission: Personal Goals and Risk Factors at Admission - 02/27/18 1147      Core Components/Risk Factors/Patient Goals on Admission    Weight Management  Yes;Obesity;Weight Maintenance;Weight Loss    Intervention  Weight Management: Develop a combined nutrition and exercise program designed to reach desired caloric intake, while maintaining appropriate intake of nutrient and fiber, sodium and fats, and appropriate energy expenditure required for the weight goal.;Weight Management: Provide education and appropriate resources to help participant work on and attain dietary goals.;Weight Management/Obesity: Establish reasonable short term and long term weight goals.;Obesity: Provide education and appropriate resources to help participant work on and attain dietary goals.    Admit Weight  179 lb 7.3 oz (81.4 kg)    Goal Weight: Short  Term  169 lb (76.7 kg)    Goal Weight: Long Term  159 lb (72.1 kg)    Expected Outcomes  Short Term: Continue to assess and modify interventions until short term weight is achieved;Long Term: Adherence to nutrition and physical activity/exercise program aimed toward attainment of established weight goal;Weight Maintenance: Understanding of the daily nutrition guidelines, which includes 25-35% calories from fat, 7% or less cal from saturated fats, less than 200mg  cholesterol, less than 1.5gm of sodium, & 5 or more servings of fruits and vegetables daily;Weight Loss: Understanding of general recommendations for a balanced deficit meal plan, which promotes 1-2 lb weight loss per week and includes a negative energy balance of [phone] kcal/d;Understanding recommendations for meals to include 15-35% energy as protein, 25-35%  energy from fat, 35-60% energy from carbohydrates, less than 200mg  of dietary cholesterol, 20-35 gm of total fiber daily;Understanding of distribution of calorie intake throughout the day with the consumption of 4-5 meals/snacks    Diabetes  Yes    Intervention  Provide education about signs/symptoms and action to take for hypo/hyperglycemia.;Provide education about proper nutrition, including hydration, and aerobic/resistive exercise prescription along with prescribed medications to achieve blood glucose in normal ranges: Fasting glucose 65-99 mg/dL    Expected Outcomes  Short Term: Participant verbalizes understanding of the signs/symptoms and immediate care of hyper/hypoglycemia, proper foot care and importance of medication, aerobic/resistive exercise and nutrition plan for blood glucose control.;Long Term: Attainment of HbA1C < 7%.    Hypertension  Yes    Intervention  Provide education on lifestyle modifcations including regular physical activity/exercise, weight management, moderate sodium restriction and increased consumption of fresh fruit, vegetables, and low fat dairy, alcohol moderation, and smoking cessation.;Monitor prescription use compliance.    Expected Outcomes  Short Term: Continued assessment and intervention until BP is < 140/18mm HG in hypertensive participants. < 130/69mm HG in hypertensive participants with diabetes, heart failure or chronic kidney disease.;Long Term: Maintenance of blood pressure at goal levels.    Lipids  Yes    Intervention  Provide education and support for participant on nutrition & aerobic/resistive exercise along with prescribed medications to achieve LDL 70mg , HDL >40mg .    Expected Outcomes  Short Term: Participant states understanding of desired cholesterol values and is compliant with medications prescribed. Participant is following exercise prescription and nutrition guidelines.;Long Term: Cholesterol controlled with medications as prescribed, with  individualized exercise RX and with personalized nutrition plan. Value goals: LDL < 70mg , HDL > 40 mg.    Stress  Yes    Intervention  Offer individual and/or small group education and counseling on adjustment to heart disease, stress management and health-related lifestyle change. Teach and support self-help strategies.;Refer participants experiencing significant psychosocial distress to appropriate mental health specialists for further evaluation and treatment. When possible, include family members and significant others in education/counseling sessions.    Expected Outcomes  Short Term: Participant demonstrates changes in health-related behavior, relaxation and other stress management skills, ability to obtain effective social support, and compliance with psychotropic medications if prescribed.;Long Term: Emotional wellbeing is indicated by absence of clinically significant psychosocial distress or social isolation.    Personal Goal Other  Yes    Personal Goal  Increase confidence with exercise and physical activity. Learn activity limitations.    Intervention  Provide cardiac education class on how to exercise on your own. Offer individual and group couseling on safety precautions with physical activity. Provide safe and effective exercise programming to increase confidence and aerobic fitness  Expected Outcomes  Pt will be able to exercise safely in the community. Pt will increase confidence level with exercise and learn activity limitations.       Core Components/Risk Factors/Patient Goals Review:  Goals and Risk Factor Review    Row Name 03/12/18 0920 03/26/18 1652 04/20/18 1547 05/23/18 1729       Core Components/Risk Factors/Patient Goals Review   Personal Goals Review  Weight Management/Obesity;Lipids;Hypertension;Stress;Diabetes  Weight Management/Obesity;Lipids;Hypertension;Stress;Diabetes  Weight Management/Obesity;Lipids;Hypertension;Stress;Diabetes  Weight  Management/Obesity;Lipids;Hypertension;Stress;Diabetes    Review  Pt with multiple CAD RFs willinng to participate in CR exercise. Oral would like to be unafraid of exercise and learn boundaries.   Pt with multiple CAD RFs willinng to participate in CR exercise. Sekai is increasing her comfort on the various machines and is tolerating exercise well.  She has been able to help her peers set up machines when they are new in the program.   Pt with multiple CAD RFs willinng to participate in CR exercise. Taleisha continues to do well with exercise.  She has great attendance.   Pt with multiple CAD RFs willinng to participate in CR exercise. Niaomi continues to have great attendance. Her BPs have been elevated but her MD wants to continue to monitor.    Expected Outcomes  Dejanay will participate in CR exercse, nutrition, and lifestyle modification opportunities.   Cornelia will participate in CR exercise, nutrition, and lifestyle modification opportunities.   Cayce will participate in CR exercise, nutrition, and lifestyle modification opportunities.   Doniesha will participate in CR exercise, nutrition, and lifestyle modification opportunities.        Core Components/Risk Factors/Patient Goals at Discharge (Final Review):  Goals and Risk Factor Review - 05/23/18 1729      Core Components/Risk Factors/Patient Goals Review   Personal Goals Review  Weight Management/Obesity;Lipids;Hypertension;Stress;Diabetes    Review  Pt with multiple CAD RFs willinng to participate in CR exercise. Lucindy continues to have great attendance. Her BPs have been elevated but her MD wants to continue to monitor.    Expected Outcomes  Nandi will participate in CR exercise, nutrition, and lifestyle modification opportunities.        ITP Comments: ITP Comments    Row Name 02/23/18 1142 03/12/18 0751 03/26/18 0943 04/20/18 1546 05/23/18 1713   ITP Comments  Dr. Fransico Him , Medical Director   Estill Bamberg started exercise today and  tolerated it well.   30 Day ITP Review.  Trenton is doing well with her recent start in the program.  She tolerating increases in workload well.  She is socializing and helping other participants.  30 Day ITP Review.  Iracema is continues to do well with exercise.  She has great attendance.  30 Day ITP Review. Adiah continues to have good attendance.  BPs have been elevated.  MD would like to continue to monitor.  Estill Bamberg graduates soon.       Comments:  See ITP Comments.

## 2018-05-25 ENCOUNTER — Encounter (HOSPITAL_COMMUNITY): Payer: BLUE CROSS/BLUE SHIELD

## 2018-05-25 ENCOUNTER — Encounter (HOSPITAL_COMMUNITY)
Admission: RE | Admit: 2018-05-25 | Discharge: 2018-05-25 | Disposition: A | Discharge status: Home / Self Care | Payer: BLUE CROSS/BLUE SHIELD | Source: Ambulatory Visit | Attending: Cardiology | Admitting: Cardiology

## 2018-05-25 DIAGNOSIS — Z955 Presence of coronary angioplasty implant and graft: Secondary | ICD-10-CM | POA: Diagnosis not present

## 2018-05-28 ENCOUNTER — Encounter (HOSPITAL_COMMUNITY)
Admission: RE | Admit: 2018-05-28 | Discharge: 2018-05-28 | Disposition: A | Discharge status: Home / Self Care | Payer: BLUE CROSS/BLUE SHIELD | Source: Ambulatory Visit | Attending: Cardiology | Admitting: Cardiology

## 2018-05-28 ENCOUNTER — Encounter (HOSPITAL_COMMUNITY): Payer: BLUE CROSS/BLUE SHIELD

## 2018-05-28 DIAGNOSIS — Z955 Presence of coronary angioplasty implant and graft: Secondary | ICD-10-CM | POA: Diagnosis not present

## 2018-05-30 ENCOUNTER — Encounter (HOSPITAL_COMMUNITY)
Admission: RE | Admit: 2018-05-30 | Discharge: 2018-05-30 | Disposition: A | Discharge status: Home / Self Care | Payer: BLUE CROSS/BLUE SHIELD | Source: Ambulatory Visit | Attending: Cardiology | Admitting: Cardiology

## 2018-05-30 ENCOUNTER — Encounter (HOSPITAL_COMMUNITY): Payer: BLUE CROSS/BLUE SHIELD

## 2018-05-30 DIAGNOSIS — Z955 Presence of coronary angioplasty implant and graft: Secondary | ICD-10-CM | POA: Diagnosis not present

## 2018-06-01 ENCOUNTER — Encounter (HOSPITAL_COMMUNITY): Payer: BLUE CROSS/BLUE SHIELD

## 2018-06-01 ENCOUNTER — Encounter (HOSPITAL_COMMUNITY)
Admission: RE | Admit: 2018-06-01 | Discharge: 2018-06-01 | Disposition: A | Discharge status: Home / Self Care | Payer: BLUE CROSS/BLUE SHIELD | Source: Ambulatory Visit | Attending: Cardiology | Admitting: Cardiology

## 2018-06-01 DIAGNOSIS — Z955 Presence of coronary angioplasty implant and graft: Secondary | ICD-10-CM

## 2018-06-04 ENCOUNTER — Encounter (HOSPITAL_COMMUNITY): Payer: BLUE CROSS/BLUE SHIELD

## 2018-06-06 ENCOUNTER — Encounter (HOSPITAL_COMMUNITY): Payer: BLUE CROSS/BLUE SHIELD

## 2018-06-08 ENCOUNTER — Encounter (HOSPITAL_COMMUNITY): Payer: BLUE CROSS/BLUE SHIELD

## 2018-06-11 ENCOUNTER — Encounter (HOSPITAL_COMMUNITY): Payer: BLUE CROSS/BLUE SHIELD

## 2018-06-13 ENCOUNTER — Encounter (HOSPITAL_COMMUNITY): Payer: BLUE CROSS/BLUE SHIELD

## 2018-06-13 NOTE — Progress Notes (Signed)
Discharge Progress Report  Patient Details  Name: Summer Hodge MRN: [id number] Date of Birth: Dec 29, 1955 Referring Provider:     CARDIAC REHAB PHASE II ORIENTATION from 02/27/2018 in [place]  Referring Provider  Candee Furbish MD       Number of Visits: 36  Reason for Discharge:  Patient reached a stable level of exercise. Patient independent in their exercise. Patient has met program and personal goals.  Smoking History:  Social History   Tobacco Use  Smoking Status Former Smoker  . Types: Cigarettes  . Last attempt to quit: 06/23/1975  . Years since quitting: 43.0  Smokeless Tobacco Never Used  Tobacco Comment   SMOKED BRIEFLY IN EARLY 20'S    Diagnosis:  Stented coronary artery 12/20/17 DES RCA ,LAD  ADL UCSD:   Initial Exercise Prescription: Initial Exercise Prescription - 02/27/18 1000      Date of Initial Exercise RX and Referring Provider   Date  02/27/18    Referring Provider  Candee Furbish MD    Expected Discharge Date  06/04/18      Treadmill   MPH  3    Grade  0    Minutes  10    METs  3.3      Bike   Level  0.7    Minutes  10    METs  2.65      NuStep   Level  3    SPM  80    Minutes  10    METs  2.5      Prescription Details   Frequency (times per week)  3    Duration  Progress to 30 minutes of continuous aerobic without signs/symptoms of physical distress      Intensity   THRR 40-80% of Max Heartrate  62-125    Ratings of Perceived Exertion  11-13    Perceived Dyspnea  0-4      Progression   Progression  Continue to progress workloads to maintain intensity without signs/symptoms of physical distress.      Resistance Training   Training Prescription  Yes    Weight  3lbs    Reps  10-15       Discharge Exercise Prescription (Final Exercise Prescription Changes): Exercise Prescription Changes - 06/01/18 1622      Response to Exercise   Blood Pressure (Admit)  120/80    Blood Pressure  (Exercise)  156/84    Blood Pressure (Exit)  124/80    Heart Rate (Admit)  70 bpm    Heart Rate (Exercise)  117 bpm    Heart Rate (Exit)  78 bpm    Rating of Perceived Exertion (Exercise)  12    Perceived Dyspnea (Exercise)  0    Symptoms  None    Comments  --    Duration  Continue with 45 min of aerobic exercise without signs/symptoms of physical distress.    Intensity  THRR unchanged      Progression   Progression  Continue to progress workloads to maintain intensity without signs/symptoms of physical distress.    Average METs  4.78      Resistance Training   Training Prescription  No      Interval Training   Interval Training  No      Treadmill   MPH  3.2    Grade  2    Minutes  10    METs  4.33      Recumbant Bike  Level  4    Minutes  10    METs  4.76      NuStep   Level  5    SPM  110    Minutes  10    METs  4      Track   Laps  --    Minutes  --    METs  --      Home Exercise Plan   Plans to continue exercise at  Home (comment)   Treadmill @ Home   Frequency  Add 2 additional days to program exercise sessions.    Initial Home Exercises Provided  03/21/18       Functional Capacity: 6 Minute Walk    Row Name 02/27/18 1009 02/27/18 1108 05/23/18 1444     6 Minute Walk   Phase  Initial  -  Discharge   Distance  1675 feet  -  1813 feet   Distance % Change  -  -  8.24 %   Distance Feet Change  -  -  138 ft   Walk Time  6 minutes  -  6 minutes   # of Rest Breaks  0  -  0   MPH  3.2  -  3.43   METS  3.4  -  4.05   RPE  12  -  12   Perceived Dyspnea   -  -  0   VO2 Peak  12.1  -  14.17   Symptoms  Yes (comment)  -  No   Comments  twinge in chest region, L-sided  -  -   Resting HR  68 bpm  -  70 bpm   Resting BP  128/68  -  148/78   Resting Oxygen Saturation   98 %  -  -   Exercise Oxygen Saturation  during 6 min walk  98 %  -  -   Max Ex. HR  93 bpm  -  117 bpm   Max Ex. BP  140/80  -  156/80   2 Minute Post BP  -  122/68  130/76       Psychological, QOL, Others - Outcomes: PHQ 2/9: Depression screen [facility] 2/9 06/01/2018 03/12/2018  Decreased Interest 1 0  Down, Depressed, Hopeless 1 1  PHQ - 2 Score 2 1  Altered sleeping 0 -  Tired, decreased energy 0 -  Change in appetite 2 -  Feeling bad or failure about yourself  1 -  Trouble concentrating 0 -  Moving slowly or fidgety/restless 0 -  Suicidal thoughts 0 -  PHQ-9 Score 5 -  Difficult doing work/chores Somewhat difficult -    Quality of Life: Quality of Life - 06/06/18 1621      Quality of Life Scores   Health/Function Pre  14.75 %    Health/Function Post  12.33 %    Health/Function % Change  -16.41 %    Socioeconomic Pre  17.43 %    Socioeconomic Post  17.36 %    Socioeconomic % Change   -0.4 %    Psych/Spiritual Pre  10.5 %    Psych/Spiritual Post  8.83 %    Psych/Spiritual % Change  -15.9 %    Family Pre  12.13 %    Family Post  13.38 %    Family % Change  10.31 %    GLOBAL Pre  14.19 %    GLOBAL Post  12.91 %  GLOBAL % Change  -9.02 %       Personal Goals: Goals established at orientation with interventions provided to work toward goal. Personal Goals and Risk Factors at Admission - 02/27/18 1147      Core Components/Risk Factors/Patient Goals on Admission    Weight Management  Yes;Obesity;Weight Maintenance;Weight Loss    Intervention  Weight Management: Develop a combined nutrition and exercise program designed to reach desired caloric intake, while maintaining appropriate intake of nutrient and fiber, sodium and fats, and appropriate energy expenditure required for the weight goal.;Weight Management: Provide education and appropriate resources to help participant work on and attain dietary goals.;Weight Management/Obesity: Establish reasonable short term and long term weight goals.;Obesity: Provide education and appropriate resources to help participant work on and attain dietary goals.    Admit Weight  179 lb 7.3 oz (81.4 kg)    Goal  Weight: Short Term  169 lb (76.7 kg)    Goal Weight: Long Term  159 lb (72.1 kg)    Expected Outcomes  Short Term: Continue to assess and modify interventions until short term weight is achieved;Long Term: Adherence to nutrition and physical activity/exercise program aimed toward attainment of established weight goal;Weight Maintenance: Understanding of the daily nutrition guidelines, which includes 25-35% calories from fat, 7% or less cal from saturated fats, less than '200mg'$  cholesterol, less than 1.5gm of sodium, & 5 or more servings of fruits and vegetables daily;Weight Loss: Understanding of general recommendations for a balanced deficit meal plan, which promotes 1-2 lb weight loss per week and includes a negative energy balance of [phone] kcal/d;Understanding recommendations for meals to include 15-35% energy as protein, 25-35% energy from fat, 35-60% energy from carbohydrates, less than '200mg'$  of dietary cholesterol, 20-35 gm of total fiber daily;Understanding of distribution of calorie intake throughout the day with the consumption of 4-5 meals/snacks    Diabetes  Yes    Intervention  Provide education about signs/symptoms and action to take for hypo/hyperglycemia.;Provide education about proper nutrition, including hydration, and aerobic/resistive exercise prescription along with prescribed medications to achieve blood glucose in normal ranges: Fasting glucose 65-99 mg/dL    Expected Outcomes  Short Term: Participant verbalizes understanding of the signs/symptoms and immediate care of hyper/hypoglycemia, proper foot care and importance of medication, aerobic/resistive exercise and nutrition plan for blood glucose control.;Long Term: Attainment of HbA1C < 7%.    Hypertension  Yes    Intervention  Provide education on lifestyle modifcations including regular physical activity/exercise, weight management, moderate sodium restriction and increased consumption of fresh fruit, vegetables, and low fat dairy,  alcohol moderation, and smoking cessation.;Monitor prescription use compliance.    Expected Outcomes  Short Term: Continued assessment and intervention until BP is < 140/78m HG in hypertensive participants. < 130/837mHG in hypertensive participants with diabetes, heart failure or chronic kidney disease.;Long Term: Maintenance of blood pressure at goal levels.    Lipids  Yes    Intervention  Provide education and support for participant on nutrition & aerobic/resistive exercise along with prescribed medications to achieve LDL '70mg'$ , HDL >'40mg'$ .    Expected Outcomes  Short Term: Participant states understanding of desired cholesterol values and is compliant with medications prescribed. Participant is following exercise prescription and nutrition guidelines.;Long Term: Cholesterol controlled with medications as prescribed, with individualized exercise RX and with personalized nutrition plan. Value goals: LDL < '70mg'$ , HDL > 40 mg.    Stress  Yes    Intervention  Offer individual and/or small group education and counseling on adjustment to heart  disease, stress management and health-related lifestyle change. Teach and support self-help strategies.;Refer participants experiencing significant psychosocial distress to appropriate mental health specialists for further evaluation and treatment. When possible, include family members and significant others in education/counseling sessions.    Expected Outcomes  Short Term: Participant demonstrates changes in health-related behavior, relaxation and other stress management skills, ability to obtain effective social support, and compliance with psychotropic medications if prescribed.;Long Term: Emotional wellbeing is indicated by absence of clinically significant psychosocial distress or social isolation.    Personal Goal Other  Yes    Personal Goal  Increase confidence with exercise and physical activity. Learn activity limitations.    Intervention  Provide cardiac  education class on how to exercise on your own. Offer individual and group couseling on safety precautions with physical activity. Provide safe and effective exercise programming to increase confidence and aerobic fitness    Expected Outcomes  Pt will be able to exercise safely in the community. Pt will increase confidence level with exercise and learn activity limitations.        Personal Goals Discharge: Goals and Risk Factor Review    Row Name 03/12/18 0920 03/26/18 1652 04/20/18 1547 05/23/18 1729 06/01/18 1400     Core Components/Risk Factors/Patient Goals Review   Personal Goals Review  Weight Management/Obesity;Lipids;Hypertension;Stress;Diabetes  Weight Management/Obesity;Lipids;Hypertension;Stress;Diabetes  Weight Management/Obesity;Lipids;Hypertension;Stress;Diabetes  Weight Management/Obesity;Lipids;Hypertension;Stress;Diabetes  Weight Management/Obesity;Lipids;Hypertension;Stress;Diabetes   Review  Pt with multiple CAD RFs willinng to participate in CR exercise. Lena would like to be unafraid of exercise and learn boundaries.   Pt with multiple CAD RFs willinng to participate in CR exercise. Arika is increasing her comfort on the various machines and is tolerating exercise well.  She has been able to help her peers set up machines when they are new in the program.   Pt with multiple CAD RFs willinng to participate in CR exercise. Anaija continues to do well with exercise.  She has great attendance.   Pt with multiple CAD RFs willinng to participate in CR exercise. Samyiah continues to have great attendance. Her BPs have been elevated but her MD wants to continue to monitor.  Pt with multiple CAD RFs willinng to participate in CR exercise. Avin tolerated exercise well. She feels that she increased her stamina.  Marissa enjoyed coming to CR.    Expected Outcomes  Miliani will participate in CR exercse, nutrition, and lifestyle modification opportunities.   Shakeena will participate in CR  exercise, nutrition, and lifestyle modification opportunities.   Chellsea will participate in CR exercise, nutrition, and lifestyle modification opportunities.   Kaytlynne will participate in CR exercise, nutrition, and lifestyle modification opportunities.   Lamar will participate CR exercise, nutrition, and lifestyle modification opportunities. She has a bike at home but will need continued motivation.       Exercise Goals and Review: Exercise Goals    Row Name 02/27/18 0816             Exercise Goals   Increase Physical Activity  Yes       Intervention  Provide advice, education, support and counseling about physical activity/exercise needs.;Develop an individualized exercise prescription for aerobic and resistive training based on initial evaluation findings, risk stratification, comorbidities and participant's personal goals.       Expected Outcomes  Short Term: Attend rehab on a regular basis to increase amount of physical activity.;Long Term: Add in home exercise to make exercise part of routine and to increase amount of physical activity.;Long Term: Exercising regularly at least 3-5  days a week.       Increase Strength and Stamina  Yes       Intervention  Provide advice, education, support and counseling about physical activity/exercise needs.;Develop an individualized exercise prescription for aerobic and resistive training based on initial evaluation findings, risk stratification, comorbidities and participant's personal goals.       Expected Outcomes  Short Term: Increase workloads from initial exercise prescription for resistance, speed, and METs.;Short Term: Perform resistance training exercises routinely during rehab and add in resistance training at home;Long Term: Improve cardiorespiratory fitness, muscular endurance and strength as measured by increased METs and functional capacity (6MWT)       Able to understand and use rate of perceived exertion (RPE) scale  Yes       Intervention   Provide education and explanation on how to use RPE scale       Expected Outcomes  Short Term: Able to use RPE daily in rehab to express subjective intensity level;Long Term:  Able to use RPE to guide intensity level when exercising independently       Knowledge and understanding of Target Heart Rate Range (THRR)  Yes       Intervention  Provide education and explanation of THRR including how the numbers were predicted and where they are located for reference       Expected Outcomes  Short Term: Able to state/look up THRR;Short Term: Able to use daily as guideline for intensity in rehab;Long Term: Able to use THRR to govern intensity when exercising independently       Able to check pulse independently  Yes       Intervention  Provide education and demonstration on how to check pulse in carotid and radial arteries.;Review the importance of being able to check your own pulse for safety during independent exercise       Expected Outcomes  Short Term: Able to explain why pulse checking is important during independent exercise;Long Term: Able to check pulse independently and accurately       Understanding of Exercise Prescription  Yes       Intervention  Provide education, explanation, and written materials on patient's individual exercise prescription       Expected Outcomes  Short Term: Able to explain program exercise prescription;Long Term: Able to explain home exercise prescription to exercise independently          Nutrition & Weight - Outcomes: Pre Biometrics - 02/27/18 1026      Pre Biometrics   Height  '5\' 1"'$  (1.549 m)    Weight  81.4 kg    Waist Circumference  40.5 inches    Hip Circumference  46 inches    Waist to Hip Ratio  0.88 %    BMI (Calculated)  33.93    Triceps Skinfold  33 mm    % Body Fat  45.5 %    Grip Strength  27 kg    Flexibility  15 in    Single Leg Stand  30 seconds      Post Biometrics - 05/23/18 1444       Post  Biometrics   Height  '5\' 1"'$  (1.549 m)     Weight  82.9 kg    Waist Circumference  38 inches    Hip Circumference  43.5 inches    Waist to Hip Ratio  0.87 %    BMI (Calculated)  34.55    Triceps Skinfold  27 mm    % Body Fat  43.8 %    Grip Strength  30 kg    Flexibility  16 in    Single Leg Stand  34 seconds       Nutrition: Nutrition Therapy & Goals - 03/14/18 0810      Nutrition Therapy   Diet  consistent carbohydrate heart healthy      Personal Nutrition Goals   Nutrition Goal  Pt to identify food quantities necessary to achieve weight loss of 6-24 lbs. at graduation from cardiac rehab. Goal wt of 20 lb desired.     Personal Goal #2  Pt to identify and limit food sources of saturated fat, trans fat, and sodium      Intervention Plan   Intervention  Prescribe, educate and counsel regarding individualized specific dietary modifications aiming towards targeted core components such as weight, hypertension, lipid management, diabetes, heart failure and other comorbidities.    Expected Outcomes  Short Term Goal: Understand basic principles of dietary content, such as calories, fat, sodium, cholesterol and nutrients.       Nutrition Discharge: Nutrition Assessments - 06/08/18 1057      MEDFICTS Scores   Pre Score  47    Post Score  12    Score Difference  -35       Education Questionnaire Score: Knowledge Questionnaire Score - 06/01/18 1539      Knowledge Questionnaire Score   Pre Score  23/24    Post Score  24/24       Goals reviewed with patient; copy given to patient.

## 2018-06-24 ENCOUNTER — Other Ambulatory Visit: Payer: Self-pay | Admitting: Physician Assistant

## 2018-06-28 DIAGNOSIS — F4010 Social phobia, unspecified: Secondary | ICD-10-CM | POA: Diagnosis not present

## 2018-06-28 DIAGNOSIS — F341 Dysthymic disorder: Secondary | ICD-10-CM | POA: Diagnosis not present

## 2018-07-10 DIAGNOSIS — H524 Presbyopia: Secondary | ICD-10-CM | POA: Diagnosis not present

## 2018-07-10 DIAGNOSIS — H26493 Other secondary cataract, bilateral: Secondary | ICD-10-CM | POA: Diagnosis not present

## 2018-07-10 DIAGNOSIS — E119 Type 2 diabetes mellitus without complications: Secondary | ICD-10-CM | POA: Diagnosis not present

## 2018-07-16 ENCOUNTER — Other Ambulatory Visit: Payer: Self-pay | Admitting: Family Medicine

## 2018-07-16 DIAGNOSIS — Z1231 Encounter for screening mammogram for malignant neoplasm of breast: Secondary | ICD-10-CM

## 2018-07-16 DIAGNOSIS — Z23 Encounter for immunization: Secondary | ICD-10-CM | POA: Diagnosis not present

## 2018-07-16 DIAGNOSIS — Z0000 Encounter for general adult medical examination without abnormal findings: Secondary | ICD-10-CM | POA: Diagnosis not present

## 2018-07-16 DIAGNOSIS — I10 Essential (primary) hypertension: Secondary | ICD-10-CM | POA: Diagnosis not present

## 2018-07-16 DIAGNOSIS — E559 Vitamin D deficiency, unspecified: Secondary | ICD-10-CM | POA: Diagnosis not present

## 2018-07-16 DIAGNOSIS — E785 Hyperlipidemia, unspecified: Secondary | ICD-10-CM | POA: Diagnosis not present

## 2018-07-16 DIAGNOSIS — E1165 Type 2 diabetes mellitus with hyperglycemia: Secondary | ICD-10-CM | POA: Diagnosis not present

## 2018-08-09 DIAGNOSIS — H26492 Other secondary cataract, left eye: Secondary | ICD-10-CM | POA: Diagnosis not present

## 2018-08-28 ENCOUNTER — Ambulatory Visit
Admission: RE | Admit: 2018-08-28 | Discharge: 2018-08-28 | Disposition: A | Discharge status: Home / Self Care | Payer: BLUE CROSS/BLUE SHIELD | Source: Ambulatory Visit | Attending: Family Medicine | Admitting: Family Medicine

## 2018-08-28 DIAGNOSIS — Z1231 Encounter for screening mammogram for malignant neoplasm of breast: Secondary | ICD-10-CM | POA: Diagnosis not present

## 2018-09-18 DIAGNOSIS — Z23 Encounter for immunization: Secondary | ICD-10-CM | POA: Diagnosis not present

## 2018-09-20 ENCOUNTER — Other Ambulatory Visit: Payer: Self-pay | Admitting: Cardiology

## 2018-09-24 DIAGNOSIS — L814 Other melanin hyperpigmentation: Secondary | ICD-10-CM | POA: Diagnosis not present

## 2018-09-24 DIAGNOSIS — Z86018 Personal history of other benign neoplasm: Secondary | ICD-10-CM | POA: Diagnosis not present

## 2018-09-24 DIAGNOSIS — D225 Melanocytic nevi of trunk: Secondary | ICD-10-CM | POA: Diagnosis not present

## 2018-09-24 DIAGNOSIS — L821 Other seborrheic keratosis: Secondary | ICD-10-CM | POA: Diagnosis not present

## 2018-09-24 DIAGNOSIS — Z23 Encounter for immunization: Secondary | ICD-10-CM | POA: Diagnosis not present

## 2018-10-10 ENCOUNTER — Encounter: Payer: Self-pay | Admitting: Nurse Practitioner

## 2018-10-10 ENCOUNTER — Telehealth: Payer: Self-pay | Admitting: Family Medicine

## 2018-10-10 ENCOUNTER — Ambulatory Visit: Payer: BLUE CROSS/BLUE SHIELD | Admitting: Nurse Practitioner

## 2018-10-10 ENCOUNTER — Telehealth: Payer: Self-pay | Admitting: *Deleted

## 2018-10-10 VITALS — BP 170/90 | HR 66 | Ht 60.0 in | Wt 192.4 lb

## 2018-10-10 DIAGNOSIS — I2510 Atherosclerotic heart disease of native coronary artery without angina pectoris: Secondary | ICD-10-CM

## 2018-10-10 DIAGNOSIS — E785 Hyperlipidemia, unspecified: Secondary | ICD-10-CM

## 2018-10-10 DIAGNOSIS — I10 Essential (primary) hypertension: Secondary | ICD-10-CM

## 2018-10-10 NOTE — Telephone Encounter (Signed)
Spoke to [place], she thought there were Nov labs. I did not see any nor did they. So disregard the call. Ottis Stain, CMA

## 2018-10-10 NOTE — Telephone Encounter (Signed)
S/w Antony Haste at Dr. Rosario Jacks office @ [phone] to fax recent labs to office.

## 2018-10-10 NOTE — Patient Instructions (Addendum)
We will be checking the following labs today - NONE  We will call over to your PCP's office and request a copy of your last labs   Medication Instructions:    Continue with your current medicines.    If you need a refill on your cardiac medications before your next appointment, please call your pharmacy.     Testing/Procedures To Be Arranged:  N/A  Follow-Up:   See Dr. Marlou Porch in August  I will see you in February 2021    At [facility], you and your health needs are our priority.  As part of our continuing mission to provide you with exceptional heart care, we have created designated Provider Care Teams.  These Care Teams include your primary Cardiologist (physician) and Advanced Practice Providers (APPs -  Physician Assistants and Nurse Practitioners) who all work together to provide you with the care you need, when you need it.  Special Instructions:  . Think about what we talked about today . Keep a check on your BP for [place] as you have been doing.  Call the [place] office at [phone] if you have any questions, problems or concerns.

## 2018-10-10 NOTE — Progress Notes (Signed)
CARDIOLOGY OFFICE NOTE  Date:  10/10/2018    Anne Shutter Date of Birth: Sep 09, 1955 Medical Record #[id number]  PCP:  Sela Hilding, MD  Cardiologist:  Marisa Cyphers    Chief Complaint  Patient presents with  . Coronary Artery Disease    6 month check - seen for Dr. Marlou Porch    History of Present Illness: Summer Hodge is a 63 y.o. female who presents today for a 6 month check. Seen for Dr. Marlou Porch.   She has a history of known CAD with prior LAD and RCA stent placement in May 2019. She has risk factors of diabetes, hyperlipidemia, hypertension as well as a brother who died of myocardial infarction at age 48. She had another brother who died at age 29 with myocardial infarction as well. She has had difficulty with statin therapy.   Last seen by Dr. Marlou Porch in August - she was doing ok - some shortness of breath and fatigue but overall she was felt to be stable. Can take Crestor 3 times a week with mild joint aching. BP was good at that visit. PCP had increased her ACE.   Comes in today. Here alone. She feels like she has had a good 6 months. She had a spell several weeks ago - had some gastric pain - took NTG with no relief - took Zantac and improved. She still has some shortness of breath with going up steps. She is not exercising regularly. She has gained 14 pounds. She admits she is eating poorly - loves carbs. Pretty sedentary. She expresses desire to stay on DAPT therapy. She has just changed to Medicare - unclear yet how much her Kary Kos will cost. She is tolerating the 3 times a week Crestor. She feels like she had her lipids checked in November - I do not see this off the [facility].   Past Medical History:  Diagnosis Date  . Allergic rhinitis   . Anemia   . Anginal pain ([place])   . Arthritis   . Coronary artery disease    12/20/17- DES to mid RCA & LAD. other non-obstructive disease.   . Crohn disease ([place])   . Depression   . Diabetes mellitus without complication  ([place])   . Family history of heart disease   . GERD (gastroesophageal reflux disease)   . Hyperlipidemia   . Hypertension   . Osteopenia   . Sleep apnea   . Vitamin D deficiency     Past Surgical History:  Procedure Laterality Date  . CARDIAC CATHETERIZATION    . CESAREAN SECTION    . CHOLECYSTECTOMY, LAPAROSCOPIC  2001  . CORONARY STENT INTERVENTION N/A 12/20/2017   Procedure: CORONARY STENT INTERVENTION;  Surgeon: Burnell Blanks, MD;  Location: [place] [facility];  Service: Cardiovascular;  Laterality: N/A;  . HEMORRHOID SURGERY  04/2010  . LEFT HEART CATH AND CORONARY ANGIOGRAPHY N/A 12/20/2017   Procedure: LEFT HEART CATH AND CORONARY ANGIOGRAPHY;  Surgeon: Burnell Blanks, MD;  Location: [place] [facility];  Service: Cardiovascular;  Laterality: N/A;     Medications: Current Meds  Medication Sig  . aspirin EC 81 MG tablet Take 81 mg by mouth at bedtime.  . carvedilol (COREG) 3.125 MG tablet Take 1 tablet (3.125 mg total) by mouth 2 (two) times daily with a meal.  . ezetimibe (ZETIA) 10 MG tablet Take 10 mg by mouth daily.   Marland Kitchen glipiZIDE (GLUCOTROL XL) 5 MG 24 hr tablet Take 5 mg  by mouth daily.  Marland Kitchen lisinopril (PRINIVIL,ZESTRIL) 10 MG tablet Take 10 mg by mouth daily.  . nitroGLYCERIN (NITROSTAT) 0.4 MG SL tablet DISSOLVE 1 TABLET UNDER THE TONGUE EVERY 5 MINUTES AS NEEDED FOR CHEST PAIN  . Polyethyl Glycol-Propyl Glycol (SYSTANE) 0.4-0.3 % SOLN Apply 1 drop to eye 2 (two) times a week.  . rosuvastatin (CRESTOR) 10 MG tablet Take 1 tablet three times weekly and increase as tolerated.  . ticagrelor (BRILINTA) 90 MG TABS tablet Take 1 tablet (90 mg total) by mouth 2 (two) times daily.     Allergies: Allergies  Allergen Reactions  . Crestor [Rosuvastatin] Other (See Comments)    MUSCLE ACHES, Headaches  . Lipitor [Atorvastatin] Other (See Comments)    MUSCLE ACHES, headaches   . Pravastatin Other (See Comments)    MUSCLE ACHES, headaches    Social  History: The patient  reports that she quit smoking about 43 years ago. Her smoking use included cigarettes. She has never used smokeless tobacco. She reports current alcohol use. She reports that she does not use drugs.   Family History: The patient's family history includes CAD in her brother, brother, brother, brother, father, and paternal grandfather; Cancer in her mother; Diabetes in her father and mother; Healthy in her son; Heart attack in her brother, brother, and paternal grandfather; Heart disease in her brother; Hepatitis C in her brother; Hypertension in her brother, brother, and mother; Hypotension in her father; Mental illness in her brother.   Review of Systems: Please see the history of present illness.   Otherwise, the review of systems is positive for none.   All other systems are reviewed and negative.   Physical Exam: VS:  BP (!) 170/90 (BP Location: Left Arm, Patient Position: Sitting, Cuff Size: Normal)   Pulse 66   Ht 5' (1.524 m)   Wt 192 lb 6.4 oz (87.3 kg)   LMP  (LMP Unknown)   SpO2 97%   BMI 37.58 kg/m  .  BMI Body mass index is 37.58 kg/m.  Wt Readings from Last 3 Encounters:  10/10/18 192 lb 6.4 oz (87.3 kg)  05/23/18 182 lb 12.2 oz (82.9 kg)  04/10/18 178 lb 12.8 oz (81.1 kg)   BP recheck by me is 140/80  General: Pleasant. Well developed, well nourished and in no acute distress.  Her weight is up 14 pounds.  HEENT: Normal.  Neck: Supple, no JVD, carotid bruits, or masses noted.  Cardiac: Regular rate and rhythm. No murmurs, rubs, or gallops. No edema.  Respiratory:  Lungs are clear to auscultation bilaterally with normal work of breathing.  GI: Soft and nontender.  MS: No deformity or atrophy. Gait and ROM intact.  Skin: Warm and dry. Color is normal.  Neuro:  Strength and sensation are intact and no gross focal deficits noted.  Psych: Alert, appropriate and with normal affect.   LABORATORY DATA:  EKG:  EKG is not ordered today.  Lab Results   Component Value Date   WBC 9.8 12/21/2017   HGB 13.2 12/21/2017   HCT 41.0 12/21/2017   PLT 282 12/21/2017   GLUCOSE 121 (H) 12/21/2017   CHOL 149 04/04/2018   TRIG 136 04/04/2018   HDL 50 04/04/2018   LDLCALC 72 04/04/2018   ALT 14 04/04/2018   AST 8 04/04/2018   NA 140 12/21/2017   K 4.1 12/21/2017   CL 107 12/21/2017   CREATININE 0.81 12/21/2017   BUN 12 12/21/2017   CO2 25 12/21/2017  BNP (last 3 results) No results for input(s): BNP in the last 8760 hours.  ProBNP (last 3 results) No results for input(s): PROBNP in the last 8760 hours.   Other Studies Reviewed Today:  CORONARY STENT INTERVENTION 12/2017  LEFT HEART CATH AND CORONARY ANGIOGRAPHY  Conclusion     Prox RCA to Mid RCA lesion is 99% stenosed.  Mid RCA lesion is 30% stenosed.  Ost RPDA to RPDA lesion is 40% stenosed.  Post Atrio lesion is 30% stenosed.  A drug-eluting stent was successfully placed using a STENT SYNERGY DES 3.5X16.  Post intervention, there is a 0% residual stenosis.  Ost 1st Mrg to 1st Mrg lesion is 60% stenosed.  Ost 3rd Mrg to 3rd Mrg lesion is 60% stenosed.  Ost Cx lesion is 50% stenosed.  Ost LAD to Prox LAD lesion is 40% stenosed.  Ost 1st Diag lesion is 50% stenosed.  Mid LAD to Dist LAD lesion is 30% stenosed.  Prox LAD to Mid LAD lesion is 80% stenosed.  A drug-eluting stent was successfully placed using a STENT SYNERGY DES 2.5X12.  Post intervention, there is a 0% residual stenosis.  The left ventricular systolic function is normal.  LV end diastolic pressure is normal.  The left ventricular ejection fraction is greater than 65% by visual estimate.  There is no mitral valve regurgitation.   1. Severe stenosis mid RCA 2. Successful PTCA/DES x 1 mid RCA 3. Severe stenosis mid LAD.  4. Successful PTCA/DES x 1 mid LAD 5. Moderate non-obstructive disease in the distal RCA branches, ostial Circumflex, distal Circumflex, proximal LAD 6. Normal LV  systolic function  Recommendations: Continue DAPT with ASA and Brilinta for one year. Continue low dose statin. Given statin intolerance, consider Praluent or Repatha. Continue beta blocker.       Assessment/Plan:  1. CAD - prior 2 vessel PCI from May 2019 - moderate residual disease with multiple CV risk factors. She wishes to stay on indefinite DAPT - her one year of therapy is not up until May. She needs to work on CV risk factor modification which was discussed in depth today. She does seem motivated to start making changes.   2. HTN - her BP diary from home looks good - recheck by me was improved. She will continue to monitor her readings at home. For now, no changes.   3. HLD - able to tolerate 3 times a week statin. Will request lipids if available.   4. Dyspnea - most likely related to weight/deconditioning at this time.   5. Weight gain - discussed at length.   6. DM - per PCP - needs lifestyle modification.   Current medicines are reviewed with the patient today.  The patient does not have concerns regarding medicines other than what has been noted above.  The following changes have been made:  See above.  Labs/ tests ordered today include:   No orders of the defined types were placed in this encounter.    Disposition:   FU with Dr. Marlou Porch in 6 months; I will see in one year.   Patient is agreeable to this plan and will call if any problems develop in the interim.   SignedTruitt Merle, NP  10/10/2018 9:43 AM  [facility] [address] [place], [place]  [address] Phone: [phone] Fax: [phone]

## 2018-10-10 NOTE — Telephone Encounter (Signed)
Pt had labs in august, Cecille Rubin already had those.

## 2018-10-10 NOTE — Telephone Encounter (Signed)
Danielle from [facility] called and she is asking a copy of the pt's  recent lab result. Please fax the document at [phone] attn to danielle right away. ad

## 2018-10-18 DIAGNOSIS — H26491 Other secondary cataract, right eye: Secondary | ICD-10-CM | POA: Diagnosis not present

## 2018-12-18 ENCOUNTER — Telehealth: Payer: Self-pay | Admitting: Cardiology

## 2018-12-18 NOTE — Telephone Encounter (Signed)
Should be ok - her year of DAPT is up as of May 1st.   Will check with Dr. Marlou Porch and if he agrees, change to Plavix 75 mg a day.

## 2018-12-18 NOTE — Telephone Encounter (Signed)
Patient called today to inquire about her Kary Kos is time to get it refilled and she is wondering if she can switched Plavix.

## 2018-12-19 ENCOUNTER — Other Ambulatory Visit: Payer: Self-pay | Admitting: *Deleted

## 2018-12-19 NOTE — Telephone Encounter (Signed)
Ok to change to Plavix 75 mg a day.

## 2018-12-20 NOTE — Telephone Encounter (Signed)
Ok with Plavix 67m QD.  MCandee Furbish MD

## 2018-12-21 ENCOUNTER — Other Ambulatory Visit: Payer: Self-pay | Admitting: *Deleted

## 2018-12-21 ENCOUNTER — Telehealth: Payer: Self-pay | Admitting: Nurse Practitioner

## 2018-12-21 MED ORDER — CLOPIDOGREL BISULFATE 75 MG PO TABS: 75.0000 mg | ORAL_TABLET | Freq: Every day | ORAL | 3 refills | Status: DC

## 2018-12-21 NOTE — Telephone Encounter (Signed)
S/w pt is aware of Dr.Skains recommendations.  Pt will start plavix one tablet by mouth (75 mg) daily, sent in today #90 to requested pharmacy after pt finishes brilinta.  Medication list updated.

## 2018-12-21 NOTE — Telephone Encounter (Signed)
New Message            Patient has a question about a mediation she is wanting take with her Plavix, pls call to advise.

## 2018-12-21 NOTE — Telephone Encounter (Signed)
Pt advised.

## 2018-12-21 NOTE — Telephone Encounter (Signed)
Pt called to ask Truitt Merle NP if she can stop the ASA since she is starting on chronic Plavix? Will forward to [facility] for her review and recommendation.

## 2018-12-21 NOTE — Telephone Encounter (Signed)
Would continue baby aspirin 81 mg with the Plavix 75 mg.   Thanks, Cecille Rubin

## 2019-02-18 DIAGNOSIS — M25561 Pain in right knee: Secondary | ICD-10-CM | POA: Diagnosis not present

## 2019-02-26 DIAGNOSIS — M25561 Pain in right knee: Secondary | ICD-10-CM | POA: Diagnosis not present

## 2019-03-01 DIAGNOSIS — M25561 Pain in right knee: Secondary | ICD-10-CM | POA: Diagnosis not present

## 2019-03-12 DIAGNOSIS — M25561 Pain in right knee: Secondary | ICD-10-CM | POA: Diagnosis not present

## 2019-03-15 DIAGNOSIS — M25561 Pain in right knee: Secondary | ICD-10-CM | POA: Diagnosis not present

## 2019-03-18 DIAGNOSIS — Z6837 Body mass index (BMI) 37.0-37.9, adult: Secondary | ICD-10-CM | POA: Diagnosis not present

## 2019-03-18 DIAGNOSIS — M25561 Pain in right knee: Secondary | ICD-10-CM | POA: Diagnosis not present

## 2019-03-20 DIAGNOSIS — M25561 Pain in right knee: Secondary | ICD-10-CM | POA: Diagnosis not present

## 2019-03-22 DIAGNOSIS — M25561 Pain in right knee: Secondary | ICD-10-CM | POA: Diagnosis not present

## 2019-03-25 DIAGNOSIS — M25561 Pain in right knee: Secondary | ICD-10-CM | POA: Diagnosis not present

## 2019-03-26 DIAGNOSIS — M25561 Pain in right knee: Secondary | ICD-10-CM | POA: Diagnosis not present

## 2019-03-27 DIAGNOSIS — M25561 Pain in right knee: Secondary | ICD-10-CM | POA: Diagnosis not present

## 2019-03-28 ENCOUNTER — Telehealth: Payer: Self-pay

## 2019-03-28 NOTE — Telephone Encounter (Signed)
   [facility] Pre-operative Risk Assessment    Request for surgical clearance:  1. What type of surgery is being performed? Right Knee Scope    2. When is this surgery scheduled? TBD   3. What type of clearance is required (medical clearance vs. Pharmacy clearance to hold med vs. Both)? Both  4. Are there any medications that need to be held prior to surgery and how long? Plavix and aspirin   5. Practice name and name of physician performing surgery? [facility], Dr. Edmonia Lynch    6. What is your office phone number [phone]    7.   What is your office fax number [phone]  8.   Anesthesia type (None, local, MAC, general) Non Specified    Mendel Ryder 03/28/2019, 4:46 PM  _________________________________________________________________   (provider comments below)

## 2019-03-29 ENCOUNTER — Encounter: Payer: Self-pay | Admitting: Cardiology

## 2019-03-29 DIAGNOSIS — I10 Essential (primary) hypertension: Secondary | ICD-10-CM | POA: Diagnosis not present

## 2019-03-29 DIAGNOSIS — E785 Hyperlipidemia, unspecified: Secondary | ICD-10-CM | POA: Diagnosis not present

## 2019-03-29 DIAGNOSIS — Z7984 Long term (current) use of oral hypoglycemic drugs: Secondary | ICD-10-CM | POA: Diagnosis not present

## 2019-03-29 DIAGNOSIS — F439 Reaction to severe stress, unspecified: Secondary | ICD-10-CM | POA: Diagnosis not present

## 2019-03-29 DIAGNOSIS — E119 Type 2 diabetes mellitus without complications: Secondary | ICD-10-CM | POA: Diagnosis not present

## 2019-03-30 DIAGNOSIS — M25561 Pain in right knee: Secondary | ICD-10-CM | POA: Diagnosis not present

## 2019-04-02 DIAGNOSIS — M25561 Pain in right knee: Secondary | ICD-10-CM | POA: Diagnosis not present

## 2019-04-03 NOTE — Telephone Encounter (Signed)
Pt contacted by phone today- doing well from a cardiac standpoint and will be cleared once Dr Marlou Porch weighs in on Plavix.  Kerin Ransom PA-C 04/03/2019 4:10 PM

## 2019-04-04 NOTE — Telephone Encounter (Signed)
   Primary Cardiologist: Candee Furbish, MD  Chart reviewed as part of pre-operative protocol coverage. Patient was contacted 04/04/2019 in reference to pre-operative risk assessment for pending surgery as outlined below.  Summer Hodge was last seen on 10/10/2018 by Truitt Merle, NP.  Summer Hodge was contacted by our office and was doing well from a cardiac perspective as of 04/03/2019.   Per Dr. Marlou Porch, the patient may hold Plavix up to 5 days prior to procedure and resume as soon as possible thereafter per surgical team.     Therefore, based on ACC/AHA guidelines, the patient would be at acceptable risk for the planned procedure without further cardiovascular testing.   I will route this recommendation to the requesting party via Epic fax function and remove from pre-op pool.  Please call with questions.  Kathyrn Drown, NP 04/04/2019, 9:03 AM

## 2019-04-04 NOTE — Telephone Encounter (Signed)
She may hold Plavix. It has been greater than one year post stents.   Thanks  Candee Furbish, MD

## 2019-04-11 DIAGNOSIS — M94261 Chondromalacia, right knee: Secondary | ICD-10-CM | POA: Diagnosis not present

## 2019-04-11 DIAGNOSIS — S83231A Complex tear of medial meniscus, current injury, right knee, initial encounter: Secondary | ICD-10-CM | POA: Diagnosis not present

## 2019-04-11 DIAGNOSIS — X58XXXA Exposure to other specified factors, initial encounter: Secondary | ICD-10-CM | POA: Diagnosis not present

## 2019-04-11 DIAGNOSIS — S83281A Other tear of lateral meniscus, current injury, right knee, initial encounter: Secondary | ICD-10-CM | POA: Diagnosis not present

## 2019-04-11 DIAGNOSIS — S83271A Complex tear of lateral meniscus, current injury, right knee, initial encounter: Secondary | ICD-10-CM | POA: Diagnosis not present

## 2019-04-11 DIAGNOSIS — Y999 Unspecified external cause status: Secondary | ICD-10-CM | POA: Diagnosis not present

## 2019-04-11 DIAGNOSIS — S83241A Other tear of medial meniscus, current injury, right knee, initial encounter: Secondary | ICD-10-CM | POA: Diagnosis not present

## 2019-04-23 ENCOUNTER — Ambulatory Visit: Payer: PPO | Admitting: Cardiology

## 2019-04-23 ENCOUNTER — Other Ambulatory Visit: Payer: Self-pay

## 2019-04-23 ENCOUNTER — Encounter: Payer: Self-pay | Admitting: Cardiology

## 2019-04-23 VITALS — BP 170/80 | HR 65 | Ht 60.0 in | Wt 195.0 lb

## 2019-04-23 DIAGNOSIS — I2510 Atherosclerotic heart disease of native coronary artery without angina pectoris: Secondary | ICD-10-CM

## 2019-04-23 DIAGNOSIS — I10 Essential (primary) hypertension: Secondary | ICD-10-CM | POA: Diagnosis not present

## 2019-04-23 DIAGNOSIS — E785 Hyperlipidemia, unspecified: Secondary | ICD-10-CM | POA: Diagnosis not present

## 2019-04-23 NOTE — Patient Instructions (Signed)
Medication Instructions:  Your physician has recommended you make the following change in your medication:  STOP PLAVIX  If you need a refill on your cardiac medications before your next appointment, please call your pharmacy.   Lab work: None Ordered  If you have labs (blood work) drawn today and your tests are completely normal, you will receive your results only by: Marland Kitchen MyChart Message (if you have MyChart) OR . A paper copy in the mail If you have any lab test that is abnormal or we need to change your treatment, we will call you to review the results.  Testing/Procedures: None Ordered  Follow-Up: At [facility], you and your health needs are our priority.  As part of our continuing mission to provide you with exceptional heart care, we have created designated Provider Care Teams.  These Care Teams include your primary Cardiologist (physician) and Advanced Practice Providers (APPs -  Physician Assistants and Nurse Practitioners) who all work together to provide you with the care you need, when you need it. You will need a follow up appointment in 1 years.  Please call our office 2 months in advance to schedule this appointment.  You may see Candee Furbish, MD or one of the following Advanced Practice Providers on your designated Care Team:   Truitt Merle, NP Cecilie Kicks, NP . Kathyrn Drown, NP

## 2019-04-23 NOTE — Progress Notes (Signed)
Cardiology Office Note    Date:  04/23/2019   ID:  Summer Hodge, DOB Jan 18, 1956, MRN [id number]  PCP:  Summer Sires, DO  Cardiologist:   Summer Furbish, MD     History of Present Illness:  Summer Hodge is a 63 y.o. female here for follow-up of coronary artery disease status post LAD and RCA stent placement in May 2019. She has risk factors of diabetes, hyperlipidemia, hypertension as well as a brother who died of myocardial infarction at age 43. She had another brother who died at age 41 with myocardial infarction as well. Many years ago she had a nuclear stress test that was low risk, Dr. Lia Hodge.  Previously was complaining of increasing dyspnea with flight of stairs. Dizziness sudden. Fluttering sensation in throat, 3-4 min. Pain in right arm. No chest pain.   In the past Crestor caused muscle aches, Lipitor causes muscle aches and pravastatin cause muscle aches.  04/10/2018-overall she has been doing quite well.  She still feeling some shortness of breath and fatigue however.  She has been doing really well with Crestor 3 times a week 10 mg.  Mild joint aches or muscle pain.  She has been enjoying cardiac rehab.  No chest pain.  She has had some minor bruising and sub-conjunctival hemorrhage noted.  No syncope.  Pressure under excellent control.  04/23/2019- here for the follow-up of coronary disease.  She is doing quite well.  Over a year post stent placement.  We discussed discontinuing dual antiplatelet therapy and continuing with aspirin only.  No chest pain fevers chills nausea vomiting syncope bleeding.  Blood pressures were reviewed.  She gave me a list.  EKG excellent.  Past Medical History:  Diagnosis Date  . Allergic rhinitis   . Anemia   . Anginal pain (Summer Hodge)   . Arthritis   . Coronary artery disease    12/20/17- DES to mid RCA & LAD. other non-obstructive disease.   . Crohn disease (Summer Hodge)   . Depression   . Diabetes mellitus without complication (Summer Hodge)   . Family history of  heart disease   . GERD (gastroesophageal reflux disease)   . Hyperlipidemia   . Hypertension   . Osteopenia   . Sleep apnea   . Vitamin D deficiency     Past Surgical History:  Procedure Laterality Date  . CARDIAC CATHETERIZATION    . CESAREAN SECTION    . CHOLECYSTECTOMY, LAPAROSCOPIC  2001  . CORONARY STENT INTERVENTION N/A 12/20/2017   Procedure: CORONARY STENT INTERVENTION;  Surgeon: Summer Blanks, MD;  Location: [facility];  Service: Cardiovascular;  Laterality: N/A;  . HEMORRHOID SURGERY  04/2010  . LEFT HEART CATH AND CORONARY ANGIOGRAPHY N/A 12/20/2017   Procedure: LEFT HEART CATH AND CORONARY ANGIOGRAPHY;  Surgeon: Summer Blanks, MD;  Location: [facility];  Service: Cardiovascular;  Laterality: N/A;    Current Medications: Outpatient Medications Prior to Visit  Medication Sig Dispense Refill  . aspirin EC 81 MG tablet Take 81 mg by mouth at bedtime.    . carvedilol (COREG) 3.125 MG tablet Take 1 tablet (3.125 mg total) by mouth 2 (two) times daily with a meal. 180 tablet 3  . ezetimibe (ZETIA) 10 MG tablet Take 10 mg by mouth daily.   1  . glipiZIDE (GLUCOTROL XL) 5 MG 24 hr tablet Take 5 mg by mouth daily.  0  . lisinopril (PRINIVIL,ZESTRIL) 10 MG tablet Take 10 mg by mouth daily.    Marland Kitchen  nitroGLYCERIN (NITROSTAT) 0.4 MG SL tablet DISSOLVE 1 TABLET UNDER THE TONGUE EVERY 5 MINUTES AS NEEDED FOR CHEST PAIN 25 tablet 4  . Polyethyl Glycol-Propyl Glycol (SYSTANE) 0.4-0.3 % SOLN Apply 1 drop to eye 2 (two) times a week.    . rosuvastatin (CRESTOR) 20 MG tablet Take 20 mg by mouth 3 (three) times a week.    . clopidogrel (PLAVIX) 75 MG tablet Take 1 tablet (75 mg total) by mouth daily. 90 tablet 3  . rosuvastatin (CRESTOR) 10 MG tablet Take 1 tablet three times weekly and increase as tolerated. 30 tablet 11   No facility-administered medications prior to visit.      Allergies:   Crestor [rosuvastatin], Lipitor [atorvastatin], and Pravastatin    Social History   Socioeconomic History  . Marital status: Married    Spouse name: Not on file  . Number of children: Not on file  . Years of education: 12 plus  . Highest education level: Not on file  Occupational History  . Not on file  Social Needs  . Financial resource strain: Not on file  . Food insecurity    Worry: Not on file    Inability: Not on file  . Transportation needs    Medical: Not on file    Non-medical: Not on file  Tobacco Use  . Smoking status: Former Smoker    Types: Cigarettes    Quit date: 06/23/1975    Years since quitting: 43.8  . Smokeless tobacco: Never Used  . Tobacco comment: SMOKED BRIEFLY IN EARLY 20'S  Substance and Sexual Activity  . Alcohol use: Yes    Comment: 1 BEER OR WINE 3-4 X A WEEK  . Drug use: No  . Sexual activity: Yes    Partners: Male    Comment: RARELY  Lifestyle  . Physical activity    Days per week: 4 days    Minutes per session: 30 min  . Stress: Very much  Relationships  . Social Herbalist on phone: Not on file    Gets together: Not on file    Attends religious service: Not on file    Active member of club or organization: Not on file    Attends meetings of clubs or organizations: Not on file    Relationship status: Not on file  Other Topics Concern  . Not on file  Social History Narrative  . Not on file     Family History:  The patient's family history includes CAD in her brother, brother, brother, brother, father, and paternal grandfather; Cancer in her mother; Diabetes in her father and mother; Healthy in her son; Heart attack in her brother, brother, and paternal grandfather; Heart disease in her brother; Hepatitis C in her brother; Hypertension in her brother, brother, and mother; Hypotension in her father; Mental illness in her brother.   ROS:   Please see the history of present illness.    Review of Systems  All other systems reviewed and are negative.     PHYSICAL EXAM:   VS:  BP (!)  170/80   Pulse 65   Ht 5' (1.524 m)   Wt 195 lb (88.5 kg)   LMP  (LMP Unknown)   SpO2 96%   BMI 38.08 kg/m    GEN: Well nourished, well developed, in no acute distress  HEENT: normal  Neck: no JVD, carotid bruits, or masses Cardiac: RRR; no murmurs, rubs, or gallops,no edema  Respiratory:  clear to auscultation bilaterally, normal  work of breathing GI: soft, nontender, nondistended, + BS MS: no deformity or atrophy  Skin: warm and dry, no rash Neuro:  Alert and Oriented x 3, Strength and sensation are intact Psych: euthymic mood, full affect    Wt Readings from Last 3 Encounters:  04/23/19 195 lb (88.5 kg)  10/10/18 192 lb 6.4 oz (87.3 kg)  05/23/18 182 lb 12.2 oz (82.9 kg)      Studies/Labs Reviewed:   EKG: 04/23/2019-normal sinus rhythm 65 with no other abnormalities.  07/07/16-sinus rhythm, 79, no other abnormalities personally viewed.  Recent Labs: No results found for requested labs within last 8760 hours.   Lipid Panel    Component Value Date/Time   CHOL 149 04/04/2018 0854   TRIG 136 04/04/2018 0854   HDL 50 04/04/2018 0854   CHOLHDL 3.0 04/04/2018 0854   CHOLHDL 4.3 CALC 07/15/2008 0818   VLDL 35 07/15/2008 0818   LDLCALC 72 04/04/2018 0854    Additional studies/ records that were reviewed today include:  Prior office notes reviewed, prior nuclear stress test the early 1990 mid 1990s was low risk, normal EF  Cath 12/20/17 1. Severe stenosis mid RCA 2. Successful PTCA/DES x 1 mid RCA 3. Severe stenosis mid LAD.  4. Successful PTCA/DES x 1 mid LAD 5. Moderate non-obstructive disease in the distal RCA branches, ostial Circumflex, distal Circumflex, proximal LAD 6. Normal LV systolic function  Recommendations: Continue DAPT with ASA and Brilinta for one year. Continue low dose statin. Given statin intolerance, consider Praluent or Repatha. Continue beta blocker.   ASSESSMENT:    1. Coronary artery disease involving native coronary artery of native heart  without angina pectoris   2. Essential hypertension   3. Hyperlipidemia, unspecified hyperlipidemia type      PLAN:  In order of problems listed above:  CAD  - Cath with RCA/LAD stent.  May 2020  -We can go ahead and stop her Plavix now.  Aspirin only.  We discussed.  -Doing well without any anginal symptoms.   Hyperlipidemia  - LDL 179 at one point. Now 35.   - Crestor 10 3x week. Some joint and muscle.  On Zetia as well.  Excellent.  Dr. Inda Merlin has been monitoring.   Diabetes mellitus  - Coronary artery disease equivalent.  - Explained to her previously that all comers with diabetes deserved to be on statin medication regardless of cholesterol levels.  Essential hypertension  - Dr. Darcus Austin recently increased her lisinopril. Blood pressures have improved.  9-monthfollow-up LCecille Rubin 12 months me  Medication Adjustments/Labs and Tests Ordered: Current medicines are reviewed at length with the patient today.  Concerns regarding medicines are outlined above.  Medication changes, Labs and Tests ordered today are listed in the Patient Instructions below. Patient Instructions  Medication Instructions:  Your physician has recommended you make the following change in your medication:  STOP PLAVIX  If you need a refill on your cardiac medications before your next appointment, please call your pharmacy.   Lab work: None Ordered  If you have labs (blood work) drawn today and your tests are completely normal, you will receive your results only by: .Marland KitchenMyChart Message (if you have MyChart) OR . A paper copy in the mail If you have any lab test that is abnormal or we need to change your treatment, we will call you to review the results.  Testing/Procedures: None Ordered  Follow-Up: [facility] and your health needs are our priority.  As part of  our continuing mission to provide you with exceptional heart care, we have created designated Provider Care Teams.  These Care  Teams include your primary Cardiologist (physician) and Advanced Practice Providers (APPs -  Physician Assistants and Nurse Practitioners) who all work together to provide you with the care you need, when you need it. You will need a follow up appointment in 1 years.  Please call our office 2 months in advance to schedule this appointment.  You may see Summer Furbish, MD or one of the following Advanced Practice Providers on your designated Care Team:   Truitt Merle, NP Cecilie Kicks, NP . Kathyrn Drown, NP       Signed, Summer Furbish, MD  04/23/2019 [address] [place] [place], [place], [place]  [address] Phone: [phone]; Fax: [phone]

## 2019-07-29 DIAGNOSIS — K5090 Crohn's disease, unspecified, without complications: Secondary | ICD-10-CM | POA: Diagnosis not present

## 2019-07-29 DIAGNOSIS — E559 Vitamin D deficiency, unspecified: Secondary | ICD-10-CM | POA: Diagnosis not present

## 2019-07-29 DIAGNOSIS — E119 Type 2 diabetes mellitus without complications: Secondary | ICD-10-CM | POA: Diagnosis not present

## 2019-07-29 DIAGNOSIS — D509 Iron deficiency anemia, unspecified: Secondary | ICD-10-CM | POA: Diagnosis not present

## 2019-07-29 DIAGNOSIS — Z0000 Encounter for general adult medical examination without abnormal findings: Secondary | ICD-10-CM | POA: Diagnosis not present

## 2019-07-29 DIAGNOSIS — Z23 Encounter for immunization: Secondary | ICD-10-CM | POA: Diagnosis not present

## 2019-07-29 DIAGNOSIS — I10 Essential (primary) hypertension: Secondary | ICD-10-CM | POA: Diagnosis not present

## 2019-07-29 DIAGNOSIS — E785 Hyperlipidemia, unspecified: Secondary | ICD-10-CM | POA: Diagnosis not present

## 2019-08-01 ENCOUNTER — Other Ambulatory Visit: Payer: Self-pay | Admitting: Physician Assistant

## 2019-09-14 ENCOUNTER — Other Ambulatory Visit: Payer: Self-pay | Admitting: Cardiology

## 2019-09-17 ENCOUNTER — Other Ambulatory Visit: Payer: Self-pay

## 2019-09-25 DIAGNOSIS — Z86018 Personal history of other benign neoplasm: Secondary | ICD-10-CM | POA: Diagnosis not present

## 2019-09-25 DIAGNOSIS — I781 Nevus, non-neoplastic: Secondary | ICD-10-CM | POA: Diagnosis not present

## 2019-09-25 DIAGNOSIS — L814 Other melanin hyperpigmentation: Secondary | ICD-10-CM | POA: Diagnosis not present

## 2019-09-25 DIAGNOSIS — Z23 Encounter for immunization: Secondary | ICD-10-CM | POA: Diagnosis not present

## 2019-09-25 DIAGNOSIS — L578 Other skin changes due to chronic exposure to nonionizing radiation: Secondary | ICD-10-CM | POA: Diagnosis not present

## 2019-09-25 DIAGNOSIS — D2221 Melanocytic nevi of right ear and external auricular canal: Secondary | ICD-10-CM | POA: Diagnosis not present

## 2019-09-25 DIAGNOSIS — D225 Melanocytic nevi of trunk: Secondary | ICD-10-CM | POA: Diagnosis not present

## 2019-09-25 DIAGNOSIS — L821 Other seborrheic keratosis: Secondary | ICD-10-CM | POA: Diagnosis not present

## 2019-10-09 ENCOUNTER — Ambulatory Visit: Payer: PPO | Admitting: Nurse Practitioner

## 2019-11-04 DIAGNOSIS — R350 Frequency of micturition: Secondary | ICD-10-CM | POA: Diagnosis not present

## 2019-11-04 DIAGNOSIS — E785 Hyperlipidemia, unspecified: Secondary | ICD-10-CM | POA: Diagnosis not present

## 2019-11-04 DIAGNOSIS — I2510 Atherosclerotic heart disease of native coronary artery without angina pectoris: Secondary | ICD-10-CM | POA: Diagnosis not present

## 2019-11-04 DIAGNOSIS — I10 Essential (primary) hypertension: Secondary | ICD-10-CM | POA: Diagnosis not present

## 2019-11-04 DIAGNOSIS — E119 Type 2 diabetes mellitus without complications: Secondary | ICD-10-CM | POA: Diagnosis not present

## 2019-11-06 DIAGNOSIS — E119 Type 2 diabetes mellitus without complications: Secondary | ICD-10-CM | POA: Diagnosis not present

## 2019-11-06 DIAGNOSIS — Z961 Presence of intraocular lens: Secondary | ICD-10-CM | POA: Diagnosis not present

## 2019-11-06 DIAGNOSIS — H5211 Myopia, right eye: Secondary | ICD-10-CM | POA: Diagnosis not present

## 2019-11-26 DIAGNOSIS — R3121 Asymptomatic microscopic hematuria: Secondary | ICD-10-CM | POA: Diagnosis not present

## 2020-01-10 ENCOUNTER — Other Ambulatory Visit: Payer: Self-pay | Admitting: Physician Assistant

## 2020-04-28 ENCOUNTER — Ambulatory Visit: Payer: PPO | Admitting: Cardiology

## 2020-04-28 ENCOUNTER — Other Ambulatory Visit: Payer: Self-pay

## 2020-04-28 VITALS — BP 166/88 | HR 63 | Ht 60.0 in | Wt 148.4 lb

## 2020-04-28 DIAGNOSIS — I2510 Atherosclerotic heart disease of native coronary artery without angina pectoris: Secondary | ICD-10-CM | POA: Diagnosis not present

## 2020-04-28 DIAGNOSIS — E119 Type 2 diabetes mellitus without complications: Secondary | ICD-10-CM

## 2020-04-28 DIAGNOSIS — I10 Essential (primary) hypertension: Secondary | ICD-10-CM

## 2020-04-28 NOTE — Progress Notes (Signed)
Cardiology Office Note:    Date:  04/28/2020   ID:  KADANCE MCCUISTION, DOB Mar 26, 1956, MRN [id number]  PCP:  Glenis Smoker, MD  [facility] Cardiologist:  Candee Furbish, MD  [place] Electrophysiologist:  None   Referring MD: No ref. provider found     History of Present Illness:    Summer Hodge is a 64 y.o. female here for follow-up of coronary artery disease.  Had LAD and RCA stent placed in May 2019.  Great job with weight loss up to 50 pounds.  Diabetes hyperlipidemia hypertension brother died of MI at age 34 and another brother died at age 51 with MI.  Prior complaints of angina were dyspnea with flights of stairs.  Had fluttering sensation in her throat lasting 3 to 4 minutes with pain in her right arm.  No chest pain.  She did ask about stopping the lisinopril-discussed that I would like to continue that for renal protection diabetes.  She is having some dizziness when bending over.  If blood pressure gets too low, would stop the carvedilol.  Denies any current fevers chills nausea vomiting syncope bleeding.  Past Medical History:  Diagnosis Date  . Allergic rhinitis   . Anemia   . Anginal pain ([place])   . Arthritis   . Coronary artery disease    12/20/17- DES to mid RCA & LAD. other non-obstructive disease.   . Crohn disease ([place])   . Depression   . Diabetes mellitus without complication ([place])   . Family history of heart disease   . GERD (gastroesophageal reflux disease)   . Hyperlipidemia   . Hypertension   . Osteopenia   . Sleep apnea   . Vitamin D deficiency     Past Surgical History:  Procedure Laterality Date  . CARDIAC CATHETERIZATION    . CESAREAN SECTION    . CHOLECYSTECTOMY, LAPAROSCOPIC  2001  . CORONARY STENT INTERVENTION N/A 12/20/2017   Procedure: CORONARY STENT INTERVENTION;  Surgeon: Burnell Blanks, MD;  Location: [facility];  Service: Cardiovascular;  Laterality: N/A;  . HEMORRHOID SURGERY  04/2010  . LEFT  HEART CATH AND CORONARY ANGIOGRAPHY N/A 12/20/2017   Procedure: LEFT HEART CATH AND CORONARY ANGIOGRAPHY;  Surgeon: Burnell Blanks, MD;  Location: [facility];  Service: Cardiovascular;  Laterality: N/A;    Current Medications: Current Meds  Medication Sig  . aspirin EC 81 MG tablet Take 81 mg by mouth at bedtime.  . carvedilol (COREG) 3.125 MG tablet TAKE 1 TABLET(3.125 MG) BY MOUTH TWICE DAILY WITH A MEAL  . ezetimibe (ZETIA) 10 MG tablet Take 10 mg by mouth daily.   Marland Kitchen lisinopril (PRINIVIL,ZESTRIL) 10 MG tablet Take 10 mg by mouth daily.  . nitroGLYCERIN (NITROSTAT) 0.4 MG SL tablet DISSOLVE 1 TABLET UNDER THE TONGUE EVERY 5 MINUTES AS NEEDED FOR CHEST PAIN  . Polyethyl Glycol-Propyl Glycol (SYSTANE) 0.4-0.3 % SOLN Apply 1 drop to eye 2 (two) times a week.  . rosuvastatin (CRESTOR) 20 MG tablet Take 20 mg by mouth 4 (four) times a week.      Allergies:   Crestor [rosuvastatin], Lipitor [atorvastatin], and Pravastatin   Social History   Socioeconomic History  . Marital status: Married    Spouse name: Not on file  . Number of children: Not on file  . Years of education: 12 plus  . Highest education level: Not on file  Occupational History  . Not on file  Tobacco Use  . Smoking  status: Former Smoker    Types: Cigarettes    Quit date: 06/23/1975    Years since quitting: 44.8  . Smokeless tobacco: Never Used  . Tobacco comment: SMOKED BRIEFLY IN EARLY 20'S  Vaping Use  . Vaping Use: Never used  Substance and Sexual Activity  . Alcohol use: Yes    Comment: 1 BEER OR WINE 3-4 X A WEEK  . Drug use: No  . Sexual activity: Yes    Partners: Male    Comment: RARELY  Other Topics Concern  . Not on file  Social History Narrative  . Not on file   Social Determinants of Health   Financial Resource Strain:   . Difficulty of Paying Living Expenses: Not on file  Food Insecurity:   . Worried About Charity fundraiser in the Last Year: Not on file  . Ran Out of Food  in the Last Year: Not on file  Transportation Needs:   . Lack of Transportation (Medical): Not on file  . Lack of Transportation (Non-Medical): Not on file  Physical Activity:   . Days of Exercise per Week: Not on file  . Minutes of Exercise per Session: Not on file  Stress:   . Feeling of Stress : Not on file  Social Connections:   . Frequency of Communication with Friends and Family: Not on file  . Frequency of Social Gatherings with Friends and Family: Not on file  . Attends Religious Services: Not on file  . Active Member of Clubs or Organizations: Not on file  . Attends Archivist Meetings: Not on file  . Marital Status: Not on file     Family History: The patient's family history includes CAD in her brother, brother, brother, brother, father, and paternal grandfather; Cancer in her mother; Diabetes in her father and mother; Healthy in her son; Heart attack in her brother, brother, and paternal grandfather; Heart disease in her brother; Hepatitis C in her brother; Hypertension in her brother, brother, and mother; Hypotension in her father; Mental illness in her brother.  ROS:   Please see the history of present illness.     All other systems reviewed and are negative.  EKGs/Labs/Other Studies Reviewed:    The following studies were reviewed today: Cath 12/20/17 1. Severe stenosis mid RCA 2. Successful PTCA/DES x 1 mid RCA 3. Severe stenosis mid LAD.  4. Successful PTCA/DES x 1 mid LAD 5. Moderate non-obstructive disease in the distal RCA branches, ostial Circumflex, distal Circumflex, proximal LAD 6. Normal LV systolic function  EKG:  EKG is  ordered today.  The ekg ordered today demonstrates sinus bradycardia 58 with no other abnormalities  Recent Labs: No results found for requested labs within last 8760 hours.  Recent Lipid Panel    Component Value Date/Time   CHOL 149 04/04/2018 0854   TRIG 136 04/04/2018 0854   HDL 50 04/04/2018 0854   CHOLHDL 3.0  04/04/2018 0854   CHOLHDL 4.3 CALC 07/15/2008 0818   VLDL 35 07/15/2008 0818   LDLCALC 72 04/04/2018 0854    Physical Exam:    VS:  BP (!) 166/88   Pulse 63   Ht 5' (1.524 m)   Wt 148 lb 6.4 oz (67.3 kg)   LMP  (LMP Unknown)   SpO2 98%   BMI 28.98 kg/m     Wt Readings from Last 3 Encounters:  04/28/20 148 lb 6.4 oz (67.3 kg)  04/23/19 195 lb (88.5 kg)  10/10/18 192 lb 6.4  oz (87.3 kg)     GEN:  Well nourished, well developed in no acute distress HEENT: Normal NECK: No JVD; No carotid bruits LYMPHATICS: No lymphadenopathy CARDIAC: RRR, no murmurs, rubs, gallops RESPIRATORY:  Clear to auscultation without rales, wheezing or rhonchi  ABDOMEN: Soft, non-tender, non-distended MUSCULOSKELETAL:  No edema; No deformity  SKIN: Warm and dry NEUROLOGIC:  Alert and oriented x 3 PSYCHIATRIC:  Normal affect   ASSESSMENT:    1. Coronary artery disease involving native coronary artery of native heart without angina pectoris   2. Essential hypertension   3. Diabetes mellitus with coincident hypertension ([place])    PLAN:    In order of problems listed above:  Coronary artery disease -RCA/LAD stent placed in May 2019.  On aspirin only at this time.  Doing well without any anginal symptoms.  Hyperlipidemia -Prior LDL 179.  Currently 64 from outside labs.  ALT 14, TSH 2.3 hemoglobin 13.2.  Creatinine 0.69. -Continue with Crestor 10 mg 4 times a week.  She is also on Zetia.  Doing well.  Dr. Inda Merlin has been watching.  Has had trouble with myalgias in the past with higher doses of Crestor.  Diabetes mellitus with hypertension -Continue a statin medication.  Dr. Inda Merlin has been monitoring her diabetes.  Last hemoglobin A1c 6.6. -On lisinopril for renal protection.  Doing well. -On low-dose carvedilol 3.125.  If she begins to feel more orthostatic type symptoms, dizziness after treadmill, this would be the first medication to stop.  Great job with weight loss, 50 pounds.  Low  carbohydrates.  1 year follow-up    Medication Adjustments/Labs and Tests Ordered: Current medicines are reviewed at length with the patient today.  Concerns regarding medicines are outlined above.  Orders Placed This Encounter  Procedures  . EKG 12-Lead   No orders of the defined types were placed in this encounter.   Patient Instructions  Medication Instructions:  The current medical regimen is effective;  continue present plan and medications.  *If you need a refill on your cardiac medications before your next appointment, please call your pharmacy*  Follow-Up: At [facility], you and your health needs are our priority.  As part of our continuing mission to provide you with exceptional heart care, we have created designated Provider Care Teams.  These Care Teams include your primary Cardiologist (physician) and Advanced Practice Providers (APPs -  Physician Assistants and Nurse Practitioners) who all work together to provide you with the care you need, when you need it.  We recommend signing up for the patient portal called "MyChart".  Sign up information is provided on this After Visit Summary.  MyChart is used to connect with patients for Virtual Visits (Telemedicine).  Patients are able to view lab/test results, encounter notes, upcoming appointments, etc.  Non-urgent messages can be sent to your provider as well.   To learn more about what you can do with MyChart, go to [email or web address].    Your next appointment:   12 month(s)  The format for your next appointment:   In Person  Provider:   Candee Furbish, MD   Thank you for choosing [facility]!!         Signed, Candee Furbish, MD  04/28/2020 10:19 AM    [place]

## 2020-04-28 NOTE — Patient Instructions (Signed)
Medication Instructions:  The current medical regimen is effective;  continue present plan and medications.  *If you need a refill on your cardiac medications before your next appointment, please call your pharmacy*  Follow-Up: At [facility], you and your health needs are our priority.  As part of our continuing mission to provide you with exceptional heart care, we have created designated Provider Care Teams.  These Care Teams include your primary Cardiologist (physician) and Advanced Practice Providers (APPs -  Physician Assistants and Nurse Practitioners) who all work together to provide you with the care you need, when you need it.  We recommend signing up for the patient portal called "MyChart".  Sign up information is provided on this After Visit Summary.  MyChart is used to connect with patients for Virtual Visits (Telemedicine).  Patients are able to view lab/test results, encounter notes, upcoming appointments, etc.  Non-urgent messages can be sent to your provider as well.   To learn more about what you can do with MyChart, go to [email or web address].    Your next appointment:   12 month(s)  The format for your next appointment:   In Person  Provider:   Mark Skains, MD   Thank you for choosing [facility]!!      

## 2020-05-07 DIAGNOSIS — E785 Hyperlipidemia, unspecified: Secondary | ICD-10-CM | POA: Diagnosis not present

## 2020-05-07 DIAGNOSIS — E119 Type 2 diabetes mellitus without complications: Secondary | ICD-10-CM | POA: Diagnosis not present

## 2020-05-07 DIAGNOSIS — D509 Iron deficiency anemia, unspecified: Secondary | ICD-10-CM | POA: Diagnosis not present

## 2020-05-07 DIAGNOSIS — Z23 Encounter for immunization: Secondary | ICD-10-CM | POA: Diagnosis not present

## 2020-06-11 ENCOUNTER — Other Ambulatory Visit: Payer: Self-pay | Admitting: Cardiology

## 2020-07-04 ENCOUNTER — Other Ambulatory Visit: Payer: Self-pay | Admitting: Physician Assistant

## 2020-07-23 DIAGNOSIS — L570 Actinic keratosis: Secondary | ICD-10-CM | POA: Diagnosis not present

## 2020-07-23 DIAGNOSIS — D485 Neoplasm of uncertain behavior of skin: Secondary | ICD-10-CM | POA: Diagnosis not present

## 2020-07-23 DIAGNOSIS — L821 Other seborrheic keratosis: Secondary | ICD-10-CM | POA: Diagnosis not present

## 2020-08-03 DIAGNOSIS — Z1211 Encounter for screening for malignant neoplasm of colon: Secondary | ICD-10-CM | POA: Diagnosis not present

## 2020-08-03 DIAGNOSIS — E559 Vitamin D deficiency, unspecified: Secondary | ICD-10-CM | POA: Diagnosis not present

## 2020-08-03 DIAGNOSIS — M8580 Other specified disorders of bone density and structure, unspecified site: Secondary | ICD-10-CM | POA: Diagnosis not present

## 2020-08-03 DIAGNOSIS — I10 Essential (primary) hypertension: Secondary | ICD-10-CM | POA: Diagnosis not present

## 2020-08-03 DIAGNOSIS — E119 Type 2 diabetes mellitus without complications: Secondary | ICD-10-CM | POA: Diagnosis not present

## 2020-08-03 DIAGNOSIS — K5090 Crohn's disease, unspecified, without complications: Secondary | ICD-10-CM | POA: Diagnosis not present

## 2020-08-03 DIAGNOSIS — Z0000 Encounter for general adult medical examination without abnormal findings: Secondary | ICD-10-CM | POA: Diagnosis not present

## 2020-08-03 DIAGNOSIS — E785 Hyperlipidemia, unspecified: Secondary | ICD-10-CM | POA: Diagnosis not present

## 2020-08-11 DIAGNOSIS — Z1211 Encounter for screening for malignant neoplasm of colon: Secondary | ICD-10-CM | POA: Diagnosis not present

## 2020-08-13 ENCOUNTER — Other Ambulatory Visit: Payer: Self-pay | Admitting: Family Medicine

## 2020-08-13 DIAGNOSIS — Z1231 Encounter for screening mammogram for malignant neoplasm of breast: Secondary | ICD-10-CM

## 2020-08-13 DIAGNOSIS — M8580 Other specified disorders of bone density and structure, unspecified site: Secondary | ICD-10-CM

## 2020-09-24 DIAGNOSIS — D2221 Melanocytic nevi of right ear and external auricular canal: Secondary | ICD-10-CM | POA: Diagnosis not present

## 2020-09-24 DIAGNOSIS — Z86018 Personal history of other benign neoplasm: Secondary | ICD-10-CM | POA: Diagnosis not present

## 2020-09-24 DIAGNOSIS — L578 Other skin changes due to chronic exposure to nonionizing radiation: Secondary | ICD-10-CM | POA: Diagnosis not present

## 2020-09-24 DIAGNOSIS — L821 Other seborrheic keratosis: Secondary | ICD-10-CM | POA: Diagnosis not present

## 2020-09-24 DIAGNOSIS — L814 Other melanin hyperpigmentation: Secondary | ICD-10-CM | POA: Diagnosis not present

## 2020-09-24 DIAGNOSIS — D225 Melanocytic nevi of trunk: Secondary | ICD-10-CM | POA: Diagnosis not present

## 2020-11-10 DIAGNOSIS — H52203 Unspecified astigmatism, bilateral: Secondary | ICD-10-CM | POA: Diagnosis not present

## 2020-11-10 DIAGNOSIS — Z961 Presence of intraocular lens: Secondary | ICD-10-CM | POA: Diagnosis not present

## 2020-11-10 DIAGNOSIS — E119 Type 2 diabetes mellitus without complications: Secondary | ICD-10-CM | POA: Diagnosis not present

## 2020-11-26 ENCOUNTER — Other Ambulatory Visit: Payer: PPO

## 2020-11-26 ENCOUNTER — Ambulatory Visit: Payer: PPO

## 2020-12-03 ENCOUNTER — Ambulatory Visit: Payer: PPO

## 2020-12-03 ENCOUNTER — Other Ambulatory Visit: Payer: Self-pay

## 2020-12-03 ENCOUNTER — Ambulatory Visit
Admission: RE | Admit: 2020-12-03 | Discharge: 2020-12-03 | Disposition: A | Discharge status: Home / Self Care | Payer: PPO | Source: Ambulatory Visit | Attending: Family Medicine | Admitting: Family Medicine

## 2020-12-03 DIAGNOSIS — M85852 Other specified disorders of bone density and structure, left thigh: Secondary | ICD-10-CM | POA: Diagnosis not present

## 2020-12-03 DIAGNOSIS — Z780 Asymptomatic menopausal state: Secondary | ICD-10-CM | POA: Diagnosis not present

## 2020-12-03 DIAGNOSIS — M8580 Other specified disorders of bone density and structure, unspecified site: Secondary | ICD-10-CM

## 2020-12-03 DIAGNOSIS — M810 Age-related osteoporosis without current pathological fracture: Secondary | ICD-10-CM | POA: Diagnosis not present

## 2020-12-16 DIAGNOSIS — M810 Age-related osteoporosis without current pathological fracture: Secondary | ICD-10-CM | POA: Diagnosis not present

## 2021-01-20 ENCOUNTER — Other Ambulatory Visit: Payer: Self-pay

## 2021-01-20 ENCOUNTER — Ambulatory Visit
Admission: RE | Admit: 2021-01-20 | Discharge: 2021-01-20 | Disposition: A | Discharge status: Home / Self Care | Payer: PPO | Source: Ambulatory Visit | Attending: Family Medicine | Admitting: Family Medicine

## 2021-01-20 DIAGNOSIS — E785 Hyperlipidemia, unspecified: Secondary | ICD-10-CM | POA: Diagnosis not present

## 2021-01-20 DIAGNOSIS — Z1231 Encounter for screening mammogram for malignant neoplasm of breast: Secondary | ICD-10-CM | POA: Diagnosis not present

## 2021-01-20 DIAGNOSIS — E1169 Type 2 diabetes mellitus with other specified complication: Secondary | ICD-10-CM | POA: Diagnosis not present

## 2021-01-20 DIAGNOSIS — G4733 Obstructive sleep apnea (adult) (pediatric): Secondary | ICD-10-CM | POA: Diagnosis not present

## 2021-01-20 DIAGNOSIS — I10 Essential (primary) hypertension: Secondary | ICD-10-CM | POA: Diagnosis not present

## 2021-01-20 DIAGNOSIS — I2510 Atherosclerotic heart disease of native coronary artery without angina pectoris: Secondary | ICD-10-CM | POA: Diagnosis not present

## 2021-01-20 DIAGNOSIS — E559 Vitamin D deficiency, unspecified: Secondary | ICD-10-CM | POA: Diagnosis not present

## 2021-03-08 ENCOUNTER — Other Ambulatory Visit: Payer: Self-pay | Admitting: Cardiology

## 2021-05-14 ENCOUNTER — Ambulatory Visit: Payer: PPO | Admitting: Cardiology

## 2021-05-14 ENCOUNTER — Encounter: Payer: Self-pay | Admitting: Cardiology

## 2021-05-14 ENCOUNTER — Other Ambulatory Visit: Payer: Self-pay

## 2021-05-14 VITALS — BP 160/86 | HR 67 | Ht 60.0 in | Wt 177.8 lb

## 2021-05-14 DIAGNOSIS — E785 Hyperlipidemia, unspecified: Secondary | ICD-10-CM

## 2021-05-14 DIAGNOSIS — I10 Essential (primary) hypertension: Secondary | ICD-10-CM

## 2021-05-14 DIAGNOSIS — E119 Type 2 diabetes mellitus without complications: Secondary | ICD-10-CM

## 2021-05-14 DIAGNOSIS — I2510 Atherosclerotic heart disease of native coronary artery without angina pectoris: Secondary | ICD-10-CM

## 2021-05-14 MED ORDER — LISINOPRIL 20 MG PO TABS: 20.0000 mg | ORAL_TABLET | Freq: Every day | ORAL | 3 refills | Status: DC

## 2021-05-14 NOTE — Progress Notes (Signed)
Cardiology Office Note:    Date:  05/14/2021   ID:  Summer Hodge, DOB 17-Sep-1955, MRN [id number]  PCP:  Glenis Smoker, MD   [facility] Providers Cardiologist:  Candee Furbish, MD     Referring MD: Glenis Smoker, *     History of Present Illness:    Summer Hodge is a 65 y.o. female here for follow-up of coronary artery disease prior LAD and RCA stents placed in 2019.  Has diabetes hypertension hyperlipidemia with family history, brother dying at age 9 from MI and another dying at age 56 with MI.  Previous symptoms were dyspnea flight of stairs.  Had a throat sensation with pain in her right arm.  No chest pain.  Overall today doing fairly well.  Exercising.  At times feels some dizziness when getting up or stopping the treadmill.  Continuing to monitor.  Blood pressures reviewed as below.   Past Medical History:  Diagnosis Date   Allergic rhinitis    Anemia    Anginal pain ([place])    Arthritis    Coronary artery disease    12/20/17- DES to mid RCA & LAD. other non-obstructive disease.    Crohn disease ([place])    Depression    Diabetes mellitus without complication ([place])    Family history of heart disease    GERD (gastroesophageal reflux disease)    Hyperlipidemia    Hypertension    Osteopenia    Sleep apnea    Vitamin D deficiency     Past Surgical History:  Procedure Laterality Date   CARDIAC CATHETERIZATION     CESAREAN SECTION     CHOLECYSTECTOMY, LAPAROSCOPIC  2001   CORONARY STENT INTERVENTION N/A 12/20/2017   Procedure: CORONARY STENT INTERVENTION;  Surgeon: Burnell Blanks, MD;  Location: [place] [facility];  Service: Cardiovascular;  Laterality: N/A;   HEMORRHOID SURGERY  04/2010   LEFT HEART CATH AND CORONARY ANGIOGRAPHY N/A 12/20/2017   Procedure: LEFT HEART CATH AND CORONARY ANGIOGRAPHY;  Surgeon: Burnell Blanks, MD;  Location: [place] [facility];  Service: Cardiovascular;  Laterality: N/A;    Current Medications: Current  Meds  Medication Sig   aspirin EC 81 MG tablet Take 81 mg by mouth at bedtime.   carvedilol (COREG) 3.125 MG tablet TAKE 1 TABLET(3.125 MG) BY MOUTH TWICE DAILY WITH A MEAL   ezetimibe (ZETIA) 10 MG tablet Take 10 mg by mouth daily.    lisinopril (ZESTRIL) 20 MG tablet Take 1 tablet (20 mg total) by mouth daily.   nitroGLYCERIN (NITROSTAT) 0.4 MG SL tablet DISSOLVE 1 TABLET UNDER THE TONGUE EVERY 5 MINUTES AS NEEDED FOR CHEST PAIN   Polyethyl Glycol-Propyl Glycol 0.4-0.3 % SOLN Apply 1 drop to eye 2 (two) times a week.   rosuvastatin (CRESTOR) 20 MG tablet Take 20 mg by mouth 4 (four) times a week.    [DISCONTINUED] lisinopril (PRINIVIL,ZESTRIL) 10 MG tablet Take 10 mg by mouth daily.     Allergies:   Crestor [rosuvastatin], Lipitor [atorvastatin], and Pravastatin   Social History   Socioeconomic History   Marital status: Married    Spouse name: Not on file   Number of children: Not on file   Years of education: 12 plus   Highest education level: Not on file  Occupational History   Not on file  Tobacco Use   Smoking status: Former    Types: Cigarettes    Quit date: 06/23/1975    Years since quitting: 1.9  Smokeless tobacco: Never   Tobacco comments:    SMOKED BRIEFLY IN EARLY 20'S  Vaping Use   Vaping Use: Never used  Substance and Sexual Activity   Alcohol use: Yes    Comment: 1 BEER OR WINE 3-4 X A WEEK   Drug use: No   Sexual activity: Yes    Partners: Male    Comment: RARELY  Other Topics Concern   Not on file  Social History Narrative   Not on file   Social Determinants of Health   Financial Resource Strain: Not on file  Food Insecurity: Not on file  Transportation Needs: Not on file  Physical Activity: Not on file  Stress: Not on file  Social Connections: Not on file     Family History: The patient's family history includes CAD in her brother, brother, brother, brother, father, and paternal grandfather; Cancer in her mother; Diabetes in her father and  mother; Healthy in her son; Heart attack in her brother, brother, and paternal grandfather; Heart disease in her brother; Hepatitis C in her brother; Hypertension in her brother, brother, and mother; Hypotension in her father; Mental illness in her brother.  ROS:   Please see the history of present illness.     All other systems reviewed and are negative.  EKGs/Labs/Other Studies Reviewed:    The following studies were reviewed today: Cath 12/20/17 1. Severe stenosis mid RCA 2. Successful PTCA/DES x 1 mid RCA 3. Severe stenosis mid LAD.  4. Successful PTCA/DES x 1 mid LAD 5. Moderate non-obstructive disease in the distal RCA branches, ostial Circumflex, distal Circumflex, proximal LAD 6. Normal LV systolic function    EKG:  EKG is  ordered today.  The ekg ordered today demonstrates sinus rhythm 67 no other abnormalities  Recent Labs: No results found for requested labs within last 8760 hours.  Recent Lipid Panel    Component Value Date/Time   CHOL 149 04/04/2018 0854   TRIG 136 04/04/2018 0854   HDL 50 04/04/2018 0854   CHOLHDL 3.0 04/04/2018 0854   CHOLHDL 4.3 CALC 07/15/2008 0818   VLDL 35 07/15/2008 0818   LDLCALC 72 04/04/2018 0854     Risk Assessment/Calculations:          Physical Exam:    VS:  BP (!) 160/86   Pulse 67   Ht 5' (1.524 m)   Wt 177 lb 12.8 oz (80.6 kg)   LMP  (LMP Unknown)   SpO2 97%   BMI 34.72 kg/m     Wt Readings from Last 3 Encounters:  05/14/21 177 lb 12.8 oz (80.6 kg)  04/28/20 148 lb 6.4 oz (67.3 kg)  04/23/19 195 lb (88.5 kg)     GEN:  Well nourished, well developed in no acute distress HEENT: Normal NECK: No JVD; No carotid bruits LYMPHATICS: No lymphadenopathy CARDIAC: RRR, no murmurs, rubs, gallops RESPIRATORY:  Clear to auscultation without rales, wheezing or rhonchi  ABDOMEN: Soft, non-tender, non-distended MUSCULOSKELETAL:  No edema; No deformity  SKIN: Warm and dry NEUROLOGIC:  Alert and oriented x 3 PSYCHIATRIC:   Normal affect   ASSESSMENT:    1. Essential hypertension   2. Coronary artery disease involving native coronary artery of native heart without angina pectoris   3. Diabetes mellitus with coincident hypertension ([place])   4. Hyperlipidemia, unspecified hyperlipidemia type   5. Cardiovascular arteriosclerosis    PLAN:    In order of problems listed above:  CAD (coronary artery disease) RCA and LAD stent placed in May  2019.  Cardiac catheterization reviewed.  Continue with aspirin 81 mg.  Also continue with Crestor as tolerated.  Zetia as well.  Last LDL in September 2021 was 74.  She is going to have blood work done by Dr. Lindell Noe soon.  Continue with current medical management.   Diabetes mellitus with coincident hypertension (HCC) Last hemoglobin A1c 6.3.  Improved from 6.6.  Continue with diet, exercise.  She is walking only, stretches.  Continue with lisinopril but we will increase from 10-20.  On average, her blood pressures are not at goal.  She showed me various numbers.  150s at times.  She does feel some dizziness after treadmill and may be after getting up from the floor.  I did ask her to perhaps check her blood pressures after she is done with her exercises to see if there is any significant decrease.  Continue with low-dose carvedilol.  Continue with weight loss.  Had lost approximately 50 pounds previous year.  Has gained some of this back.  Hyperlipidemia Continuing with maximally tolerated statin as well as Zetia.  Because of her coronary atherosclerosis, we will also check carotid Dopplers.  She had asked for this.      Medication Adjustments/Labs and Tests Ordered: Current medicines are reviewed at length with the patient today.  Concerns regarding medicines are outlined above.  Orders Placed This Encounter  Procedures   EKG 12-Lead   VAS US CAROTID    Meds ordered this encounter  Medications   lisinopril (ZESTRIL) 20 MG tablet    Sig: Take 1 tablet (20 mg  total) by mouth daily.    Dispense:  90 tablet    Refill:  3    Dose increase     Patient Instructions  Medication Instructions:   INCREASE YOUR LISINOPRIL TO 20 MG BY MOUTH DAILY  *If you need a refill on your cardiac medications before your next appointment, please call your pharmacy*   Testing/Procedures:  Your physician has requested that you have a carotid duplex. This test is an ultrasound of the carotid arteries in your neck. It looks at blood flow through these arteries that supply the brain with blood. Allow one hour for this exam. There are no restrictions or special instructions.  Follow-Up: At [facility], you and your health needs are our priority.  As part of our continuing mission to provide you with exceptional heart care, we have created designated Provider Care Teams.  These Care Teams include your primary Cardiologist (physician) and Advanced Practice Providers (APPs -  Physician Assistants and Nurse Practitioners) who all work together to provide you with the care you need, when you need it.  We recommend signing up for the patient portal called "MyChart".  Sign up information is provided on this After Visit Summary.  MyChart is used to connect with patients for Virtual Visits (Telemedicine).  Patients are able to view lab/test results, encounter notes, upcoming appointments, etc.  Non-urgent messages can be sent to your provider as well.   To learn more about what you can do with MyChart, go to [email or web address].    Your next appointment:   1 year(s)  The format for your next appointment:   In Person  Provider:   Candee Furbish, MD      Signed, Candee Furbish, MD  05/14/2021 8:56 AM    [place]

## 2021-05-14 NOTE — Assessment & Plan Note (Signed)
RCA and LAD stent placed in May 2019.  Cardiac catheterization reviewed.  Continue with aspirin 81 mg.  Also continue with Crestor as tolerated.  Zetia as well.  Last LDL in September 2021 was 74.  She is going to have blood work done by Dr. Lindell Noe soon.  Continue with current medical management.

## 2021-05-14 NOTE — Patient Instructions (Signed)
Medication Instructions:   INCREASE YOUR LISINOPRIL TO 20 MG BY MOUTH DAILY  *If you need a refill on your cardiac medications before your next appointment, please call your pharmacy*   Testing/Procedures:  Your physician has requested that you have a carotid duplex. This test is an ultrasound of the carotid arteries in your neck. It looks at blood flow through these arteries that supply the brain with blood. Allow one hour for this exam. There are no restrictions or special instructions.  Follow-Up: At [facility], you and your health needs are our priority.  As part of our continuing mission to provide you with exceptional heart care, we have created designated Provider Care Teams.  These Care Teams include your primary Cardiologist (physician) and Advanced Practice Providers (APPs -  Physician Assistants and Nurse Practitioners) who all work together to provide you with the care you need, when you need it.  We recommend signing up for the patient portal called "MyChart".  Sign up information is provided on this After Visit Summary.  MyChart is used to connect with patients for Virtual Visits (Telemedicine).  Patients are able to view lab/test results, encounter notes, upcoming appointments, etc.  Non-urgent messages can be sent to your provider as well.   To learn more about what you can do with MyChart, go to [email or web address].    Your next appointment:   1 year(s)  The format for your next appointment:   In Person  Provider:   Candee Furbish, MD

## 2021-05-14 NOTE — Assessment & Plan Note (Signed)
Continuing with maximally tolerated statin as well as Zetia.  Because of her coronary atherosclerosis, we will also check carotid Dopplers.  She had asked for this.

## 2021-05-14 NOTE — Assessment & Plan Note (Addendum)
Last hemoglobin A1c 6.3.  Improved from 6.6.  Continue with diet, exercise.  She is walking only, stretches.  Continue with lisinopril but we will increase from 10-20.  On average, her blood pressures are not at goal.  She showed me various numbers.  150s at times.  She does feel some dizziness after treadmill and may be after getting up from the floor.  I did ask her to perhaps check her blood pressures after she is done with her exercises to see if there is any significant decrease.  Continue with low-dose carvedilol.  Continue with weight loss.  Had lost approximately 50 pounds previous year.  Has gained some of this back.

## 2021-05-25 ENCOUNTER — Ambulatory Visit (HOSPITAL_COMMUNITY)
Admission: RE | Admit: 2021-05-25 | Discharge: 2021-05-25 | Disposition: A | Discharge status: Home / Self Care | Payer: PPO | Source: Ambulatory Visit | Attending: Cardiovascular Disease | Admitting: Cardiovascular Disease

## 2021-05-25 ENCOUNTER — Other Ambulatory Visit: Payer: Self-pay

## 2021-05-25 DIAGNOSIS — I6522 Occlusion and stenosis of left carotid artery: Secondary | ICD-10-CM

## 2021-05-25 DIAGNOSIS — E785 Hyperlipidemia, unspecified: Secondary | ICD-10-CM | POA: Insufficient documentation

## 2021-05-25 DIAGNOSIS — E119 Type 2 diabetes mellitus without complications: Secondary | ICD-10-CM | POA: Diagnosis not present

## 2021-05-25 DIAGNOSIS — I2510 Atherosclerotic heart disease of native coronary artery without angina pectoris: Secondary | ICD-10-CM | POA: Diagnosis not present

## 2021-05-25 DIAGNOSIS — I10 Essential (primary) hypertension: Secondary | ICD-10-CM | POA: Insufficient documentation

## 2021-06-03 ENCOUNTER — Telehealth: Payer: Self-pay | Admitting: *Deleted

## 2021-06-03 DIAGNOSIS — Z95820 Peripheral vascular angioplasty status with implants and grafts: Secondary | ICD-10-CM

## 2021-06-03 DIAGNOSIS — Z79899 Other long term (current) drug therapy: Secondary | ICD-10-CM

## 2021-06-03 DIAGNOSIS — E119 Type 2 diabetes mellitus without complications: Secondary | ICD-10-CM

## 2021-06-03 DIAGNOSIS — I2510 Atherosclerotic heart disease of native coronary artery without angina pectoris: Secondary | ICD-10-CM

## 2021-06-03 DIAGNOSIS — I10 Essential (primary) hypertension: Secondary | ICD-10-CM

## 2021-06-03 DIAGNOSIS — E785 Hyperlipidemia, unspecified: Secondary | ICD-10-CM

## 2021-06-03 NOTE — Telephone Encounter (Signed)
-----   Message from Leeroy Bock, [place] sent at 05/27/2021  1:05 PM EDT ----- LDL 74 on most recent lipid panel 04/2020 at [facility], pt currently taking rosuvastatin 74m 4x/week (per dispense report - our med list shows 282m and ezetimibe 1021maily. Would be happy to see pt in lipid clinic to discuss addition of PCSK9i therapy (her insurance should cover Repatha at $90/3 month supply) if she is interested.

## 2021-06-03 NOTE — Telephone Encounter (Signed)
Jerline Pain, MD  05/27/2021  7:03 AM EDT     Left sided carotid plaque noted (not significant to require intervention however) Given CAD, stenting, carotid plaque, family history, let's see if we can add PCSK9 inhibitor (Repatha for instance) to reduce LDL even further for more protection. I will forward to [place] on pharmacy team.  Thanks Candee Furbish, MD

## 2021-06-03 NOTE — Telephone Encounter (Signed)
Pt made aware of carotid US results and recommendations per Dr. Marlou Porch and [facility].  Pt states she is interested in seeing lipid clinic here in the office, for consideration of PCKS9-Inhibitors and further lipid management.  Referral to lipid clinic placed in the system. Scheduled the pt to come in and see Pharmacist in lipid clinic for consult on next Tuesday 10/18 at 1100.  Pt aware to arrive 15 mins prior to this appt.  Pt verbalized understanding and agrees with this plan.

## 2021-06-05 ENCOUNTER — Other Ambulatory Visit: Payer: Self-pay | Admitting: Physician Assistant

## 2021-06-06 ENCOUNTER — Other Ambulatory Visit: Payer: Self-pay | Admitting: Cardiology

## 2021-06-07 ENCOUNTER — Other Ambulatory Visit: Payer: Self-pay

## 2021-06-07 NOTE — Progress Notes (Signed)
Patient ID: Summer Hodge                 DOB: 02/08/1956                    MRN: [id number]     HPI: Summer Hodge is a 65 y.o. female patient referred to lipid clinic by Dr. Marlou Porch. PMH is significant for angioplasty w/ stent to RCA and LAD in 2019, CAD with unstable angina, T2DM and HLD. She also has noted statin intolerance and a family history of premature heart disease with a brother who died at 33 with MI.  Patient was last seen on 05/14/2021 by Dr. Marlou Porch for follow-up and referred to lipid clinic based on PMH. Last lipid panel in 04/2020 showed LDL of 74. Carotid dopplers on 10/4 show ICA with 1-39% stenosis and hemodynamically visual plaque >50% in the CCA.   Today, patient presents to lipid clinic inquiring about Repatha. She experiences muscle aches on current regimen but states it is tolerable. She also reports recent weight loss this year, but has since regained.  Current Medications: rosuvastatin 20 mg four times a week, ezetimibe 10 mg daily  Intolerances: pravastatin, atorvastatin, rosuvastatin (muscle aches, headaches) Risk Factors: ASCVD, T2DM, FHx of premature heart disease LDL goal: LDL <55  Diet: She has been eating more carbs and sweets lately - Lost weight at the beginning of this year, but has since regained  Exercise: walks for 45 minutes on treadmill 3-4 times a week at a pace of 3 mph; regular mobility and stretching exercises  Family History: The patient's family history includes CAD in her brother, brother, brother, brother, father, and paternal grandfather; Cancer in her mother; Diabetes in her father and mother; Healthy in her son; Heart attack in her brother, brother, and paternal grandfather; Heart disease in her brother; Hepatitis C in her brother; Hypertension in her brother, brother, and mother; Hypotension in her father; Mental illness in her brother.  Social History: She reports that she quit smoking about 45 years ago. Her smoking use included cigarettes.  She has never used smokeless tobacco. She reports current alcohol use. She reports that she does not use drugs.  Labs: 04/2020: TC 146 TG 104 HDL 53 LDL 74  Past Medical History:  Diagnosis Date   Allergic rhinitis    Anemia    Anginal pain ([place])    Arthritis    Coronary artery disease    12/20/17- DES to mid RCA & LAD. other non-obstructive disease.    Crohn disease ([place])    Depression    Diabetes mellitus without complication ([place])    Family history of heart disease    GERD (gastroesophageal reflux disease)    Hyperlipidemia    Hypertension    Osteopenia    Sleep apnea    Vitamin D deficiency     Current Outpatient Medications on File Prior to Visit  Medication Sig Dispense Refill   aspirin EC 81 MG tablet Take 81 mg by mouth at bedtime.     carvedilol (COREG) 3.125 MG tablet TAKE 1 TABLET(3.125 MG) BY MOUTH TWICE DAILY WITH A MEAL 180 tablet 2   ezetimibe (ZETIA) 10 MG tablet Take 10 mg by mouth daily.   1   lisinopril (ZESTRIL) 20 MG tablet Take 1 tablet (20 mg total) by mouth daily. 90 tablet 3   nitroGLYCERIN (NITROSTAT) 0.4 MG SL tablet DISSOLVE 1 TABLET UNDER THE TONGUE EVERY 5 MINUTES AS NEEDED FOR CHEST PAIN 25  tablet 2   Polyethyl Glycol-Propyl Glycol 0.4-0.3 % SOLN Apply 1 drop to eye 2 (two) times a week.     rosuvastatin (CRESTOR) 20 MG tablet Take 20 mg by mouth 4 (four) times a week.      No current facility-administered medications on file prior to visit.    Allergies  Allergen Reactions   Crestor [Rosuvastatin] Other (See Comments)    MUSCLE ACHES, Headaches   Lipitor [Atorvastatin] Other (See Comments)    MUSCLE ACHES, headaches    Pravastatin Other (See Comments)    MUSCLE ACHES, headaches    Assessment/Plan:  1. Hyperlipidemia - LDL is not at goal <55 based on ASCVD + DM. Discussed addition of PCSK9i which pt is agreeable to. Will start Repatha 173m Q2W, prior authorization approved through 09/08/21. Will continue rosuvastatin 20 mg 4x per week and  ezetimibe 10 mg daily. Will also complete fasting lipid panel today to assess baseline as most recent panel is from over a year ago.  Patient wishes to continue all antihyperlipidemic meds for now. Can consider discontinuing ezetimibe or decrease dose of rosuvastatin to 5 or 10 mg to minimize myalgias.   Counseled patient on injection technique and importance of lipid-lowering therapy.  [facility]  MCyd Silence Pharm D. Candidate  [facility] CChristiana Supple, PharmD, BCACP, CNorth [place] [place] [address]Phone: [phone] Fax: [phone]/18/2022 12:02 PM

## 2021-06-08 ENCOUNTER — Other Ambulatory Visit: Payer: Self-pay

## 2021-06-08 ENCOUNTER — Ambulatory Visit: Payer: PPO | Admitting: Pharmacist

## 2021-06-08 DIAGNOSIS — E785 Hyperlipidemia, unspecified: Secondary | ICD-10-CM | POA: Diagnosis not present

## 2021-06-08 LAB — LIPID PANEL
Chol/HDL Ratio: 2.7 ratio (ref 0.0–4.4)
Cholesterol, Total: 188 mg/dL (ref 100–199)
HDL: 70 mg/dL (ref >39)
LDL Chol Calc (NIH): 94 mg/dL (ref 0–99)
Triglycerides: 142 mg/dL (ref 0–149)
VLDL Cholesterol Cal: 24 mg/dL (ref 5–40)

## 2021-06-08 LAB — LDL CHOLESTEROL, DIRECT: LDL Direct: 99 mg/dL (ref 0–99)

## 2021-06-08 MED ORDER — REPATHA SURECLICK 140 MG/ML ~~LOC~~ SOAJ: 1.0000 "pen " | SUBCUTANEOUS | 3 refills | Status: DC

## 2021-06-08 NOTE — Patient Instructions (Signed)
It was nice to see you today.   Your most recent LDL is 74. Your goal LDL is <55.  Will submit Repatha for approval to insurance. Begin inject Repatha every 2 weeks into the abdomen. Continue rosuvastatin 20 mg 4 times a week and ezetimibe 10 mg daily.   Follow up in 6-8 weeks for fasting labwork at the [facility] office.

## 2021-08-04 DIAGNOSIS — I10 Essential (primary) hypertension: Secondary | ICD-10-CM | POA: Diagnosis not present

## 2021-08-04 DIAGNOSIS — E559 Vitamin D deficiency, unspecified: Secondary | ICD-10-CM | POA: Diagnosis not present

## 2021-08-04 DIAGNOSIS — Z1211 Encounter for screening for malignant neoplasm of colon: Secondary | ICD-10-CM | POA: Diagnosis not present

## 2021-08-04 DIAGNOSIS — K5090 Crohn's disease, unspecified, without complications: Secondary | ICD-10-CM | POA: Diagnosis not present

## 2021-08-04 DIAGNOSIS — E785 Hyperlipidemia, unspecified: Secondary | ICD-10-CM | POA: Diagnosis not present

## 2021-08-04 DIAGNOSIS — Z0000 Encounter for general adult medical examination without abnormal findings: Secondary | ICD-10-CM | POA: Diagnosis not present

## 2021-08-04 DIAGNOSIS — D509 Iron deficiency anemia, unspecified: Secondary | ICD-10-CM | POA: Diagnosis not present

## 2021-08-04 DIAGNOSIS — E1169 Type 2 diabetes mellitus with other specified complication: Secondary | ICD-10-CM | POA: Diagnosis not present

## 2021-08-04 DIAGNOSIS — E119 Type 2 diabetes mellitus without complications: Secondary | ICD-10-CM | POA: Diagnosis not present

## 2021-08-05 LAB — LIPID PANEL
Chol/HDL Ratio: 2 ratio (ref 0.0–4.4)
Cholesterol, Total: 129 mg/dL (ref 100–199)
HDL: 63 mg/dL (ref >39)
LDL Chol Calc (NIH): 47 mg/dL (ref 0–99)
Triglycerides: 107 mg/dL (ref 0–149)
VLDL Cholesterol Cal: 19 mg/dL (ref 5–40)

## 2021-09-30 DIAGNOSIS — L578 Other skin changes due to chronic exposure to nonionizing radiation: Secondary | ICD-10-CM | POA: Diagnosis not present

## 2021-09-30 DIAGNOSIS — D1801 Hemangioma of skin and subcutaneous tissue: Secondary | ICD-10-CM | POA: Diagnosis not present

## 2021-09-30 DIAGNOSIS — L821 Other seborrheic keratosis: Secondary | ICD-10-CM | POA: Diagnosis not present

## 2021-09-30 DIAGNOSIS — Z86018 Personal history of other benign neoplasm: Secondary | ICD-10-CM | POA: Diagnosis not present

## 2021-09-30 DIAGNOSIS — L814 Other melanin hyperpigmentation: Secondary | ICD-10-CM | POA: Diagnosis not present

## 2021-09-30 DIAGNOSIS — Z23 Encounter for immunization: Secondary | ICD-10-CM | POA: Diagnosis not present

## 2021-09-30 DIAGNOSIS — D225 Melanocytic nevi of trunk: Secondary | ICD-10-CM | POA: Diagnosis not present

## 2021-09-30 DIAGNOSIS — D2221 Melanocytic nevi of right ear and external auricular canal: Secondary | ICD-10-CM | POA: Diagnosis not present

## 2021-09-30 DIAGNOSIS — L820 Inflamed seborrheic keratosis: Secondary | ICD-10-CM | POA: Diagnosis not present

## 2021-09-30 DIAGNOSIS — D485 Neoplasm of uncertain behavior of skin: Secondary | ICD-10-CM | POA: Diagnosis not present

## 2021-11-16 DIAGNOSIS — E119 Type 2 diabetes mellitus without complications: Secondary | ICD-10-CM | POA: Diagnosis not present

## 2021-11-16 DIAGNOSIS — Z961 Presence of intraocular lens: Secondary | ICD-10-CM | POA: Diagnosis not present

## 2021-11-16 DIAGNOSIS — H52203 Unspecified astigmatism, bilateral: Secondary | ICD-10-CM | POA: Diagnosis not present

## 2021-11-16 DIAGNOSIS — D23121 Other benign neoplasm of skin of left upper eyelid, including canthus: Secondary | ICD-10-CM | POA: Diagnosis not present

## 2021-12-06 ENCOUNTER — Telehealth: Payer: Self-pay | Admitting: Cardiology

## 2021-12-06 NOTE — Telephone Encounter (Signed)
Pt c/o medication issue: ? ?1. Name of Medication: Evolocumab (REPATHA SURECLICK) 182 MG/ML SOAJ ? ?2. How are you currently taking this medication (dosage and times per day)? Inject 1 pen into the skin every 14 (fourteen) days. ? ?3. Are you having a reaction (difficulty breathing--STAT)? no ? ?4. What is your medication issue? Calling to get information to getting patient approve for the medication  ? ?

## 2021-12-07 NOTE — Telephone Encounter (Signed)
[employer] called LVM requesting more info on PA ?[phone] ?

## 2021-12-07 NOTE — Telephone Encounter (Signed)
Returned a call to hta help desk to provide more info as requested.  ?

## 2021-12-13 NOTE — Telephone Encounter (Signed)
Called pt to see if she had requested teir exception but had to leave msg  ?

## 2021-12-14 NOTE — Telephone Encounter (Signed)
Patient called and LVM stating that [employer] approved her Repatha and she picked it up yesterday for a month supply. You can call her on her cell if you have any additional questions. ?

## 2022-01-18 ENCOUNTER — Other Ambulatory Visit: Payer: Self-pay | Admitting: Physician Assistant

## 2022-01-24 DIAGNOSIS — Z1211 Encounter for screening for malignant neoplasm of colon: Secondary | ICD-10-CM | POA: Diagnosis not present

## 2022-01-24 DIAGNOSIS — M5412 Radiculopathy, cervical region: Secondary | ICD-10-CM | POA: Diagnosis not present

## 2022-01-24 DIAGNOSIS — R202 Paresthesia of skin: Secondary | ICD-10-CM | POA: Diagnosis not present

## 2022-01-24 DIAGNOSIS — E1169 Type 2 diabetes mellitus with other specified complication: Secondary | ICD-10-CM | POA: Diagnosis not present

## 2022-01-26 ENCOUNTER — Other Ambulatory Visit: Payer: Self-pay | Admitting: Family Medicine

## 2022-01-26 DIAGNOSIS — M5412 Radiculopathy, cervical region: Secondary | ICD-10-CM

## 2022-01-31 ENCOUNTER — Encounter: Payer: Self-pay | Admitting: Cardiology

## 2022-01-31 DIAGNOSIS — Z1211 Encounter for screening for malignant neoplasm of colon: Secondary | ICD-10-CM | POA: Diagnosis not present

## 2022-02-06 ENCOUNTER — Ambulatory Visit
Admission: RE | Admit: 2022-02-06 | Discharge: 2022-02-06 | Disposition: A | Discharge status: Home / Self Care | Payer: PPO | Source: Ambulatory Visit | Attending: Family Medicine | Admitting: Family Medicine

## 2022-02-06 DIAGNOSIS — R202 Paresthesia of skin: Secondary | ICD-10-CM | POA: Diagnosis not present

## 2022-02-06 DIAGNOSIS — M542 Cervicalgia: Secondary | ICD-10-CM | POA: Diagnosis not present

## 2022-02-06 DIAGNOSIS — M5412 Radiculopathy, cervical region: Secondary | ICD-10-CM

## 2022-02-11 DIAGNOSIS — E785 Hyperlipidemia, unspecified: Secondary | ICD-10-CM | POA: Diagnosis not present

## 2022-02-11 DIAGNOSIS — E1169 Type 2 diabetes mellitus with other specified complication: Secondary | ICD-10-CM | POA: Diagnosis not present

## 2022-02-11 DIAGNOSIS — K5090 Crohn's disease, unspecified, without complications: Secondary | ICD-10-CM | POA: Diagnosis not present

## 2022-02-11 DIAGNOSIS — R200 Anesthesia of skin: Secondary | ICD-10-CM | POA: Diagnosis not present

## 2022-02-11 DIAGNOSIS — Z6835 Body mass index (BMI) 35.0-35.9, adult: Secondary | ICD-10-CM | POA: Diagnosis not present

## 2022-02-21 ENCOUNTER — Other Ambulatory Visit: Payer: Self-pay | Admitting: Family Medicine

## 2022-02-21 DIAGNOSIS — Z1231 Encounter for screening mammogram for malignant neoplasm of breast: Secondary | ICD-10-CM

## 2022-03-03 ENCOUNTER — Other Ambulatory Visit: Payer: Self-pay | Admitting: Cardiology

## 2022-03-11 ENCOUNTER — Ambulatory Visit
Admission: RE | Admit: 2022-03-11 | Discharge: 2022-03-11 | Disposition: A | Discharge status: Home / Self Care | Payer: PPO | Source: Ambulatory Visit | Attending: Family Medicine | Admitting: Family Medicine

## 2022-03-11 DIAGNOSIS — Z1231 Encounter for screening mammogram for malignant neoplasm of breast: Secondary | ICD-10-CM

## 2022-03-14 DIAGNOSIS — E1169 Type 2 diabetes mellitus with other specified complication: Secondary | ICD-10-CM | POA: Diagnosis not present

## 2022-04-06 DIAGNOSIS — G5603 Carpal tunnel syndrome, bilateral upper limbs: Secondary | ICD-10-CM | POA: Diagnosis not present

## 2022-04-18 DIAGNOSIS — G5602 Carpal tunnel syndrome, left upper limb: Secondary | ICD-10-CM | POA: Diagnosis not present

## 2022-04-18 DIAGNOSIS — G5601 Carpal tunnel syndrome, right upper limb: Secondary | ICD-10-CM | POA: Diagnosis not present

## 2022-04-26 DIAGNOSIS — E119 Type 2 diabetes mellitus without complications: Secondary | ICD-10-CM | POA: Diagnosis not present

## 2022-05-09 DIAGNOSIS — G5601 Carpal tunnel syndrome, right upper limb: Secondary | ICD-10-CM | POA: Diagnosis not present

## 2022-05-17 ENCOUNTER — Other Ambulatory Visit: Payer: Self-pay | Admitting: Cardiology

## 2022-05-17 DIAGNOSIS — E785 Hyperlipidemia, unspecified: Secondary | ICD-10-CM

## 2022-05-17 DIAGNOSIS — E119 Type 2 diabetes mellitus without complications: Secondary | ICD-10-CM

## 2022-05-17 DIAGNOSIS — I2510 Atherosclerotic heart disease of native coronary artery without angina pectoris: Secondary | ICD-10-CM

## 2022-05-17 DIAGNOSIS — I10 Essential (primary) hypertension: Secondary | ICD-10-CM

## 2022-05-19 ENCOUNTER — Encounter: Payer: Self-pay | Admitting: Cardiology

## 2022-05-19 ENCOUNTER — Ambulatory Visit: Payer: PPO | Attending: Cardiology | Admitting: Cardiology

## 2022-05-19 VITALS — BP 152/88 | HR 67 | Ht 60.0 in | Wt 181.0 lb

## 2022-05-19 DIAGNOSIS — E785 Hyperlipidemia, unspecified: Secondary | ICD-10-CM | POA: Diagnosis not present

## 2022-05-19 DIAGNOSIS — I2510 Atherosclerotic heart disease of native coronary artery without angina pectoris: Secondary | ICD-10-CM

## 2022-05-19 MED ORDER — IRBESARTAN 150 MG PO TABS: 150.0000 mg | ORAL_TABLET | Freq: Every day | ORAL | 3 refills | Status: DC

## 2022-05-19 NOTE — Patient Instructions (Addendum)
Medication Instructions:  Your physician has recommended you make the following change in your medication:   Stop taking Lisinopril/Zestril  Start taking Irbesartan/Avapro 129m daily    *If you need a refill on your cardiac medications before your next appointment, please call your pharmacy*   Follow-Up: [facility] you and your health needs are our priority.  As part of our continuing mission to provide you with exceptional heart care, we have created designated Provider Care Teams.  These Care Teams include your primary Cardiologist (physician) and Advanced Practice Providers (APPs -  Physician Assistants and Nurse Practitioners) who all work together to provide you with the care you need, when you need it.  We recommend signing up for the patient portal called "MyChart".  Sign up information is provided on this After Visit Summary.  MyChart is used to connect with patients for Virtual Visits (Telemedicine).  Patients are able to view lab/test results, encounter notes, upcoming appointments, etc.  Non-urgent messages can be sent to your provider as well.   To learn more about what you can do with MyChart, go to [email or web address]    Your next appointment:   1 year  The format for your next appointment:   In Person  Provider:   MCandee Furbish MD     Important Information About Sugar

## 2022-05-19 NOTE — Progress Notes (Signed)
Cardiology Office Note:    Date:  05/19/2022   ID:  Summer Hodge, DOB March 03, 1956, MRN [id number]  PCP:  Glenis Smoker, MD   [facility] Providers Cardiologist:  Candee Furbish, MD     Referring MD: Glenis Smoker, *     History of Present Illness:    Summer Hodge is a 66 y.o. female here for follow-up of coronary artery disease prior LAD and RCA stents placed in 2019.  Has diabetes hypertension hyperlipidemia with family history, brother dying at age 73 from MI and another dying at age 41 with MI.  Previous symptoms were dyspnea flight of stairs.  Had a throat sensation with pain in her right arm.  No chest pain.  At her last visit on 05/14/2021 she report that overall she was doing fairly well.  Exercising.  At times felt some dizziness when getting up or stopping the treadmill. She was continuing to monitor these symptoms. Blood pressures were reviewed.  On 01/31/2022 she was advised by Dr. Lindell Noe to add a glucose lowering medication and called the office to see which would be appropriate because she can not tolerate Metformin. She reported headaches and fatigue with this medication. She was reporting home blood pressure of 120/75 in the mornings and ranging 145/80-155/85 in the afternoons. Pharmacist recommended Linsinopril with careful monitoring for UTIs.   Today she reports she has been feeling well. She recently had a carpal tunnel release. She has had a couple rare episodes of angina over the last 6 months but no other symptoms.   Blood pressure was a little elevated in the office today, 152/88. At home it has been variable but has generally been around 132/75-135/8. She usually will have a higher initial reading when she takes it at home, 150s/80s, but goes down after she has been sitting.   Recently she has been started on the Jardiance and is doing well on it. She wonders if she may be able to stop her rosuvastatin since she is on the [place] now.   She  denies any palpitations, shortness of breath, or peripheral edema. No lightheadedness, headaches, syncope, orthopnea, or PND.     Past Medical History:  Diagnosis Date   Allergic rhinitis    Anemia    Anginal pain ([place])    Arthritis    Coronary artery disease    12/20/17- DES to mid RCA & LAD. other non-obstructive disease.    Crohn disease ([place])    Depression    Diabetes mellitus without complication ([place])    Family history of heart disease    GERD (gastroesophageal reflux disease)    Hyperlipidemia    Hypertension    Osteopenia    Sleep apnea    Vitamin D deficiency     Past Surgical History:  Procedure Laterality Date   CARDIAC CATHETERIZATION     CESAREAN SECTION     CHOLECYSTECTOMY, LAPAROSCOPIC  2001   CORONARY STENT INTERVENTION N/A 12/20/2017   Procedure: CORONARY STENT INTERVENTION;  Surgeon: Burnell Blanks, MD;  Location: [place] [facility];  Service: Cardiovascular;  Laterality: N/A;   HEMORRHOID SURGERY  04/2010   LEFT HEART CATH AND CORONARY ANGIOGRAPHY N/A 12/20/2017   Procedure: LEFT HEART CATH AND CORONARY ANGIOGRAPHY;  Surgeon: Burnell Blanks, MD;  Location: [place] [facility];  Service: Cardiovascular;  Laterality: N/A;    Current Medications: Current Meds  Medication Sig   aspirin EC 81 MG tablet Take 81 mg by mouth at bedtime.  carvedilol (COREG) 3.125 MG tablet TAKE 1 TABLET(3.125 MG) BY MOUTH TWICE DAILY WITH A MEAL   Cholecalciferol (D3-1000 PO) Take by mouth.   cyanocobalamin (VITAMIN B12) 1000 MCG tablet Take 1,000 mcg by mouth daily.   Evolocumab (REPATHA SURECLICK) 242 MG/ML SOAJ Inject 1 pen into the skin every 14 (fourteen) days.   ezetimibe (ZETIA) 10 MG tablet Take 10 mg by mouth daily.    irbesartan (AVAPRO) 150 MG tablet Take 1 tablet (150 mg total) by mouth daily.   JANUVIA 100 MG tablet Take 100 mg by mouth daily.   nitroGLYCERIN (NITROSTAT) 0.4 MG SL tablet DISSOLVE 1 TABLET UNDER THE TONGUE EVERY 5 MINUTES AS NEEDED FOR  CHEST PAIN. Please call [phone] to schedule an appointment for future refills. Thank you.   Polyethyl Glycol-Propyl Glycol 0.4-0.3 % SOLN Apply 1 drop to eye 2 (two) times a week.   rosuvastatin (CRESTOR) 10 MG tablet Take 1 tablet by mouth daily.   [DISCONTINUED] lisinopril (ZESTRIL) 20 MG tablet Take 1 tablet (20 mg total) by mouth daily. Please schedule appt for future refills. 1st attempt     Allergies:   Crestor [rosuvastatin], Lipitor [atorvastatin], and Pravastatin   Social History   Socioeconomic History   Marital status: Married    Spouse name: Not on file   Number of children: Not on file   Years of education: 12 plus   Highest education level: Not on file  Occupational History   Not on file  Tobacco Use   Smoking status: Former    Types: Cigarettes    Quit date: 06/23/1975    Years since quitting: 46.9   Smokeless tobacco: Never   Tobacco comments:    SMOKED BRIEFLY IN EARLY 20'S  Vaping Use   Vaping Use: Never used  Substance and Sexual Activity   Alcohol use: Yes    Comment: 1 BEER OR WINE 3-4 X A WEEK   Drug use: No   Sexual activity: Yes    Partners: Male    Comment: RARELY  Other Topics Concern   Not on file  Social History Narrative   Not on file   Social Determinants of Health   Financial Resource Strain: Not on file  Food Insecurity: Not on file  Transportation Needs: Not on file  Physical Activity: Insufficiently Active (02/27/2018)   Exercise Vital Sign    Days of Exercise per Week: 4 days    Minutes of Exercise per Session: 30 min  Stress: Stress Concern Present (02/27/2018)   [place]    Feeling of Stress : Very much  Social Connections: Not on file     Family History: The patient's family history includes CAD in her brother, brother, brother, brother, father, and paternal grandfather; Cancer in her mother; Diabetes in her father and mother; Healthy in her son; Heart  attack in her brother, brother, and paternal grandfather; Heart disease in her brother; Hepatitis C in her brother; Hypertension in her brother, brother, and mother; Hypotension in her father; Mental illness in her brother.  ROS:   Please see the history of present illness.  (+) chest pain    All other systems reviewed and are negative.  EKGs/Labs/Other Studies Reviewed:    The following studies were reviewed today:  Bilateral Carotid Dopplers 05/25/2021: Summary:  Right Carotid: There is no evidence of stenosis in the right ICA. The  extracranial vessels were near-normal with only minimal  wall                 thickening or plaque.   Left Carotid: Velocities in the left ICA are consistent with a 1-39%  stenosis.                Hemodynamically significant plaque >50% visualized in the  CCA.   Vertebrals:  Bilateral vertebral arteries demonstrate antegrade flow.  Subclavians: Normal flow hemodynamics were seen in bilateral subclavian               arteries.   Cath 12/20/17 1. Severe stenosis mid RCA 2. Successful PTCA/DES x 1 mid RCA 3. Severe stenosis mid LAD.  4. Successful PTCA/DES x 1 mid LAD 5. Moderate non-obstructive disease in the distal RCA branches, ostial Circumflex, distal Circumflex, proximal LAD 6. Normal LV systolic function    EKG:  EKG is personally reviewed.  05/19/2022: Sinus rhythm 65 bpm. 05/14/2021: Sinus rhythm 67 no other abnormalities  Recent Labs: No results found for requested labs within last 365 days.  Recent Lipid Panel    Component Value Date/Time   CHOL 129 08/04/2021 1022   TRIG 107 08/04/2021 1022   HDL 63 08/04/2021 1022   CHOLHDL 2.0 08/04/2021 1022   CHOLHDL 4.3 CALC 07/15/2008 0818   VLDL 35 07/15/2008 0818   LDLCALC 47 08/04/2021 1022   LDLDIRECT 99 06/08/2021 1126     Risk Assessment/Calculations:          Physical Exam:    VS:  BP (!) 152/88   Pulse 67   Ht 5' (1.524 m)   Wt 181 lb (82.1 kg)   LMP   (LMP Unknown)   SpO2 96%   BMI 35.35 kg/m     Wt Readings from Last 3 Encounters:  05/19/22 181 lb (82.1 kg)  05/14/21 177 lb 12.8 oz (80.6 kg)  04/28/20 148 lb 6.4 oz (67.3 kg)     GEN:  Well nourished, well developed in no acute distress HEENT: Normal NECK: No JVD; No carotid bruits LYMPHATICS: No lymphadenopathy CARDIAC: RRR, no murmurs, rubs, gallops RESPIRATORY:  Clear to auscultation without rales, wheezing or rhonchi  ABDOMEN: Soft, non-tender, non-distended MUSCULOSKELETAL:  No edema; No deformity  SKIN: Warm and dry NEUROLOGIC:  Alert and oriented x 3 PSYCHIATRIC:  Normal affect   ASSESSMENT:    1. Coronary artery disease involving native coronary artery of native heart without angina pectoris   2. Hyperlipidemia, unspecified hyperlipidemia type     PLAN:    In order of problems listed above:     Diabetes mellitus with coincident hypertension ([place]) Last hemoglobin A1c 6.9.   Continue with diet, exercise.  She is walking only, stretches.  We will go ahead and change her lisinopril 20 over to irbesartan 150 mg once a day.  Angiotensin receptor blocker, potential for less side effects.  She showed me various numbers.  150s at times.    Continue with weight loss.  Had lost approximately 50 pounds previous year.  Has gained some of this back.   Hyperlipidemia Continuing with Repatha and maximally tolerated statin as well as Zetia.  Because of her coronary atherosclerosis, we will also check carotid Dopplers.  She had asked for this.  Her LDL was 47.  Excellent.   Follow Up: 1 year  Medication Adjustments/Labs and Tests Ordered: Current medicines are reviewed at length with the patient today.  Concerns regarding medicines are outlined above.  Orders  Placed This Encounter  Procedures   EKG 12-Lead    Meds ordered this encounter  Medications   irbesartan (AVAPRO) 150 MG tablet    Sig: Take 1 tablet (150 mg total) by mouth daily.    Dispense:  90 tablet     Refill:  3     Patient Instructions  Medication Instructions:  Your physician has recommended you make the following change in your medication:   Stop taking Lisinopril/Zestril  Start taking Irbesartan/Avapro 199m daily    *If you need a refill on your cardiac medications before your next appointment, please call your pharmacy*   Follow-Up: At [facility] you and your health needs are our priority.  As part of our continuing mission to provide you with exceptional heart care, we have created designated Provider Care Teams.  These Care Teams include your primary Cardiologist (physician) and Advanced Practice Providers (APPs -  Physician Assistants and Nurse Practitioners) who all work together to provide you with the care you need, when you need it.  We recommend signing up for the patient portal called "MyChart".  Sign up information is provided on this After Visit Summary.  MyChart is used to connect with patients for Virtual Visits (Telemedicine).  Patients are able to view lab/test results, encounter notes, upcoming appointments, etc.  Non-urgent messages can be sent to your provider as well.   To learn more about what you can do with MyChart, go to [email or web address]    Your next appointment:   1 day(s)  The format for your next appointment:   In Person  Provider:   MCandee Furbish MD     Important Information About [place],acting as a scribe for MCandee Furbish MD.,have documented all relevant documentation on the behalf of MCandee Furbish MD,as directed by  MCandee Furbish MD while in the presence of MCandee Furbish MD.   I, MCandee Furbish MD, have reviewed all documentation for this visit. The documentation on 05/19/22 for the exam, diagnosis, procedures, and orders are all accurate and complete.   Signed, MCandee Furbish MD  05/19/2022 9:16 AM    [facility]

## 2022-05-20 ENCOUNTER — Other Ambulatory Visit: Payer: Self-pay | Admitting: Cardiology

## 2022-05-20 DIAGNOSIS — I2510 Atherosclerotic heart disease of native coronary artery without angina pectoris: Secondary | ICD-10-CM

## 2022-05-20 DIAGNOSIS — E785 Hyperlipidemia, unspecified: Secondary | ICD-10-CM

## 2022-06-01 ENCOUNTER — Other Ambulatory Visit: Payer: Self-pay | Admitting: Cardiology

## 2022-07-05 ENCOUNTER — Other Ambulatory Visit (HOSPITAL_COMMUNITY): Payer: Self-pay | Admitting: Family Medicine

## 2022-07-05 DIAGNOSIS — R519 Headache, unspecified: Secondary | ICD-10-CM | POA: Diagnosis not present

## 2022-07-05 DIAGNOSIS — H532 Diplopia: Secondary | ICD-10-CM | POA: Diagnosis not present

## 2022-07-07 ENCOUNTER — Ambulatory Visit (HOSPITAL_COMMUNITY)
Admission: RE | Admit: 2022-07-07 | Discharge: 2022-07-07 | Disposition: A | Discharge status: Home / Self Care | Payer: PPO | Source: Ambulatory Visit | Attending: Family Medicine | Admitting: Family Medicine

## 2022-07-07 DIAGNOSIS — R519 Headache, unspecified: Secondary | ICD-10-CM

## 2022-07-07 DIAGNOSIS — H532 Diplopia: Secondary | ICD-10-CM

## 2022-07-07 MED ORDER — GADOBUTROL 1 MMOL/ML IV SOLN
8.0000 mL | Freq: Once | INTRAVENOUS | Status: AC | PRN
  Administered 2022-07-07: 8 mL via INTRAVENOUS

## 2022-07-08 DIAGNOSIS — H532 Diplopia: Secondary | ICD-10-CM | POA: Diagnosis not present

## 2022-07-26 DIAGNOSIS — H4922 Sixth [abducent] nerve palsy, left eye: Secondary | ICD-10-CM | POA: Diagnosis not present

## 2022-08-31 ENCOUNTER — Ambulatory Visit: Payer: PPO | Admitting: Neurology

## 2022-08-31 DIAGNOSIS — H532 Diplopia: Secondary | ICD-10-CM | POA: Diagnosis not present

## 2022-09-07 DIAGNOSIS — E785 Hyperlipidemia, unspecified: Secondary | ICD-10-CM | POA: Diagnosis not present

## 2022-09-07 DIAGNOSIS — E1169 Type 2 diabetes mellitus with other specified complication: Secondary | ICD-10-CM | POA: Diagnosis not present

## 2022-09-07 DIAGNOSIS — I10 Essential (primary) hypertension: Secondary | ICD-10-CM | POA: Diagnosis not present

## 2022-09-07 DIAGNOSIS — K5090 Crohn's disease, unspecified, without complications: Secondary | ICD-10-CM | POA: Diagnosis not present

## 2022-09-07 DIAGNOSIS — E559 Vitamin D deficiency, unspecified: Secondary | ICD-10-CM | POA: Diagnosis not present

## 2022-09-07 DIAGNOSIS — Z23 Encounter for immunization: Secondary | ICD-10-CM | POA: Diagnosis not present

## 2022-09-07 DIAGNOSIS — D509 Iron deficiency anemia, unspecified: Secondary | ICD-10-CM | POA: Diagnosis not present

## 2022-09-07 DIAGNOSIS — Z1211 Encounter for screening for malignant neoplasm of colon: Secondary | ICD-10-CM | POA: Diagnosis not present

## 2022-09-07 DIAGNOSIS — Z0000 Encounter for general adult medical examination without abnormal findings: Secondary | ICD-10-CM | POA: Diagnosis not present

## 2022-09-07 DIAGNOSIS — M810 Age-related osteoporosis without current pathological fracture: Secondary | ICD-10-CM | POA: Diagnosis not present

## 2022-09-07 DIAGNOSIS — E119 Type 2 diabetes mellitus without complications: Secondary | ICD-10-CM | POA: Diagnosis not present

## 2022-09-12 ENCOUNTER — Ambulatory Visit: Payer: PPO | Admitting: Neurology

## 2022-09-12 ENCOUNTER — Encounter: Payer: Self-pay | Admitting: Neurology

## 2022-09-12 VITALS — BP 186/73 | HR 69 | Ht 60.0 in | Wt 182.0 lb

## 2022-09-12 DIAGNOSIS — H4921 Sixth [abducent] nerve palsy, right eye: Secondary | ICD-10-CM

## 2022-09-12 NOTE — Patient Instructions (Signed)
Return as needed

## 2022-09-12 NOTE — Progress Notes (Signed)
[facility]  PATIENT: Summer Hodge DOB: 10/25/1955  REQUESTING CLINICIAN: Glenis Smoker, * HISTORY FROM: Patient  REASON FOR VISIT: Cranial nerve 6 Palsy    HISTORICAL  CHIEF COMPLAINT:  Chief Complaint  Patient presents with   New Patient (Initial Visit)    Rm 13, alone NP Paper referral for Sixth cranial nerve palsy, ? TIA Sela Hilding MD     HISTORY OF PRESENT ILLNESS:  This is a 67 year old woman past medical history including hypertension, hyperlipidemia, diabetes mellitus who is presenting for a right cranial nerve VI palsy.  Patient reported around October 2023, she was having mild headache on the right side.  On November 7 she was having double vision when driving, she reported she could not see clearly.  She presented to her PCP, and had MRI on November 16 which was negative.  She was seen by ophthalmologist that same day and was diagnosed with cranial nerve VI palsy.  At that time she had extensive workup including TSH, T4, CRP, thyroid peroxidase and myasthenia gravis profile which were all negative. She denies any trauma, no car accident, no fall and no concussion.  She was diagnosed with cranial nerve VI palsy and was told that her symptoms will likely improve over time.  She reports as of today that she is not having any double vision, her vision is getting better even at times when she can feel little visual distortion but no double vision she fell like her vision is back to normal.   OTHER MEDICAL CONDITIONS: Diabetes Mellitus, Hyperlipidemia,  Hypertension     REVIEW OF SYSTEMS: Full 14 system review of systems performed and negative with exception of: As noted in the HPI   ALLERGIES: Allergies  Allergen Reactions   Crestor [Rosuvastatin] Other (See Comments)    MUSCLE ACHES, Headaches   Lipitor [Atorvastatin] Other (See Comments)    MUSCLE ACHES, headaches    Pravastatin Other (See Comments)    MUSCLE ACHES, headaches     HOME MEDICATIONS: Outpatient Medications Prior to Visit  Medication Sig Dispense Refill   aspirin EC 81 MG tablet Take 81 mg by mouth at bedtime.     carvedilol (COREG) 3.125 MG tablet TAKE 1 TABLET(3.125 MG) BY MOUTH TWICE DAILY WITH A MEAL 180 tablet 3   Cholecalciferol (D3-1000 PO) Take by mouth.     cyanocobalamin (VITAMIN B12) 1000 MCG tablet Take 1,000 mcg by mouth daily.     ezetimibe (ZETIA) 10 MG tablet Take 10 mg by mouth daily.   1   irbesartan (AVAPRO) 150 MG tablet Take 1 tablet (150 mg total) by mouth daily. 90 tablet 3   JANUVIA 100 MG tablet Take 100 mg by mouth daily.     nitroGLYCERIN (NITROSTAT) 0.4 MG SL tablet DISSOLVE 1 TABLET UNDER THE TONGUE EVERY 5 MINUTES AS NEEDED FOR CHEST PAIN. Please call [phone] to schedule an appointment for future refills. Thank you. 25 tablet 2   REPATHA SURECLICK 322 MG/ML SOAJ INJECT 1 PEN UNDER THE SKIN EVERY 14 DAYS 6 mL 3   rosuvastatin (CRESTOR) 10 MG tablet Take 1 tablet by mouth daily.     Polyethyl Glycol-Propyl Glycol 0.4-0.3 % SOLN Apply 1 drop to eye 2 (two) times a week.     No facility-administered medications prior to visit.    PAST MEDICAL HISTORY: Past Medical History:  Diagnosis Date   Allergic rhinitis    Anemia    Anginal pain ([facility])    Arthritis  Coronary artery disease    12/20/17- DES to mid RCA & LAD. other non-obstructive disease.    Crohn disease ([place])    Depression    Diabetes mellitus without complication ([place])    Family history of heart disease    GERD (gastroesophageal reflux disease)    Hyperlipidemia    Hypertension    Osteopenia    Sleep apnea    Vitamin D deficiency     PAST SURGICAL HISTORY: Past Surgical History:  Procedure Laterality Date   CARDIAC CATHETERIZATION     CESAREAN SECTION     CHOLECYSTECTOMY, LAPAROSCOPIC  2001   CORONARY STENT INTERVENTION N/A 12/20/2017   Procedure: CORONARY STENT INTERVENTION;  Surgeon: Burnell Blanks, MD;  Location: [facility] [facility];  Service: Cardiovascular;  Laterality: N/A;   HEMORRHOID SURGERY  04/2010   LEFT HEART CATH AND CORONARY ANGIOGRAPHY N/A 12/20/2017   Procedure: LEFT HEART CATH AND CORONARY ANGIOGRAPHY;  Surgeon: Burnell Blanks, MD;  Location: [place] [facility];  Service: Cardiovascular;  Laterality: N/A;    FAMILY HISTORY: Family History  Problem Relation Age of Onset   Cancer Mother        PANCREATIC   Diabetes Mother    Hypertension Mother    CAD Father    Hypotension Father    Diabetes Father    Heart attack Brother    CAD Brother    Heart attack Paternal Grandfather    CAD Paternal Grandfather    Hepatitis C Brother    Heart disease Brother        STENTS   Hypertension Brother    CAD Brother    Mental illness Brother        SCHIZOPHRENIA OR BIPOLAR DEPRESSION   Hypertension Brother    CAD Brother    Healthy Son    Heart attack Brother    CAD Brother     SOCIAL HISTORY: Social History   Socioeconomic History   Marital status: Married    Spouse name: Not on file   Number of children: Not on file   Years of education: 12 plus   Highest education level: Not on file  Occupational History   Not on file  Tobacco Use   Smoking status: Former    Types: Cigarettes    Quit date: 06/23/1975    Years since quitting: 47.2   Smokeless tobacco: Never   Tobacco comments:    SMOKED BRIEFLY IN EARLY 20'S  Vaping Use   Vaping Use: Never used  Substance and Sexual Activity   Alcohol use: Yes    Comment: 1 BEER OR WINE 3-4 X A WEEK   Drug use: No   Sexual activity: Yes    Partners: Male    Comment: RARELY  Other Topics Concern   Not on file  Social History Narrative   Not on file   Social Determinants of Health   Financial Resource Strain: Not on file  Food Insecurity: Not on file  Transportation Needs: Not on file  Physical Activity: Insufficiently Active (02/27/2018)   Exercise Vital Sign    Days of Exercise per Week: 4 days    Minutes of Exercise per Session: 30  min  Stress: Stress Concern Present (02/27/2018)   [place]    Feeling of Stress : Very much  Social Connections: Not on file  Intimate Partner Violence: Not on file    PHYSICAL EXAM  GENERAL EXAM/CONSTITUTIONAL: Vitals:  Vitals:  09/12/22 1311  BP: (!) 186/73  Pulse: 69  Weight: 182 lb (82.6 kg)  Height: 5' (1.524 m)   Body mass index is 35.54 kg/m. Wt Readings from Last 3 Encounters:  09/12/22 182 lb (82.6 kg)  05/19/22 181 lb (82.1 kg)  05/14/21 177 lb 12.8 oz (80.6 kg)   Patient is in no distress; well developed, nourished and groomed; neck is supple  EYES: Visual fields full to confrontation, Extraocular movements intacts,   MUSCULOSKELETAL: Gait, strength, tone, movements noted in Neurologic exam below  NEUROLOGIC: MENTAL STATUS:      No data to display         awake, alert, oriented to person, place and time recent and remote memory intact normal attention and concentration language fluent, comprehension intact, naming intact fund of knowledge appropriate  CRANIAL NERVE:  2nd, 3rd, 4th, 6th - Pupil round and reactive to light. Visual fields full to confrontation, extraocular muscles intact, no nystagmus, no double vision 5th - facial sensation symmetric 7th - facial strength symmetric 8th - hearing intact 9th - palate elevates symmetrically, uvula midline 11th - shoulder shrug symmetric 12th - tongue protrusion midline  MOTOR:  normal bulk and tone, full strength in the BUE, BLE  SENSORY:  normal and symmetric to light touch  COORDINATION:  finger-nose-finger, fine finger movements normal  GAIT/STATION:  normal     DIAGNOSTIC DATA (LABS, IMAGING, TESTING) - I reviewed patient records, labs, notes, testing and imaging myself where available.  Lab Results  Component Value Date   WBC 9.8 12/21/2017   HGB 13.2 12/21/2017   HCT 41.0 12/21/2017   MCV 83.2 12/21/2017    PLT 282 12/21/2017      Component Value Date/Time   NA 140 12/21/2017 0228   NA 139 12/19/2017 1558   K 4.1 12/21/2017 0228   CL 107 12/21/2017 0228   CO2 25 12/21/2017 0228   GLUCOSE 121 (H) 12/21/2017 0228   BUN 12 12/21/2017 0228   BUN 10 12/19/2017 1558   CREATININE 0.81 12/21/2017 0228   CALCIUM 9.3 12/21/2017 0228   PROT 6.7 04/04/2018 0854   ALBUMIN 4.3 04/04/2018 0854   AST 8 04/04/2018 0854   ALT 14 04/04/2018 0854   ALKPHOS 74 04/04/2018 0854   BILITOT 1.0 04/04/2018 0854   GFRNONAA >60 12/21/2017 0228   GFRAA >60 12/21/2017 0228   Lab Results  Component Value Date   CHOL 129 08/04/2021   HDL 63 08/04/2021   LDLCALC 47 08/04/2021   LDLDIRECT 99 06/08/2021   TRIG 107 08/04/2021   CHOLHDL 2.0 08/04/2021   No results found for: "HGBA1C" No results found for: "VITAMINB12" No results found for: "TSH"  MRI Brain 07/07/2022 Negative brain MRI.No explanation for symptoms     ASSESSMENT AND PLAN  67 y.o. year old female with vascular risk factor including hypertension, hyperlipidemia, diabetes mellitus, who is presenting after being diagnosed with cranial nerve VI palsy back in November 16.  At that time she denied any trauma, no concussion no fall, her MRI was negative for any acute intracranial abnormality.  She also had extensive workup including thyroid, CRP, myasthenia gravis profile and they were all within normal limits.  Her cranial nerve VI palsy was likely secondary to microvascular ischemic changes of the CN VI.  Her eye movement have improved and on exam today she had full extraocular movement and there were no nystagmus.  At this point, I have informed patient that her symptoms will continue to improve she can  go back to exercising and follow-up with her PCP.  Return as needed.   1. Cranial nerve VI palsy, right      Patient Instructions  Return as needed   No orders of the defined types were placed in this encounter.   No orders of the defined  types were placed in this encounter.   Return if symptoms worsen or fail to improve.    Alric Ran, MD 09/12/2022, 2:20 PM  [facility] [address] [place], [place] [address] [phone]

## 2022-09-29 ENCOUNTER — Ambulatory Visit: Payer: PPO | Admitting: Neurology

## 2022-10-03 DIAGNOSIS — M65311 Trigger thumb, right thumb: Secondary | ICD-10-CM | POA: Diagnosis not present

## 2022-10-04 DIAGNOSIS — L821 Other seborrheic keratosis: Secondary | ICD-10-CM | POA: Diagnosis not present

## 2022-10-04 DIAGNOSIS — D2221 Melanocytic nevi of right ear and external auricular canal: Secondary | ICD-10-CM | POA: Diagnosis not present

## 2022-10-04 DIAGNOSIS — D2222 Melanocytic nevi of left ear and external auricular canal: Secondary | ICD-10-CM | POA: Diagnosis not present

## 2022-10-04 DIAGNOSIS — Z86018 Personal history of other benign neoplasm: Secondary | ICD-10-CM | POA: Diagnosis not present

## 2022-10-04 DIAGNOSIS — D225 Melanocytic nevi of trunk: Secondary | ICD-10-CM | POA: Diagnosis not present

## 2022-10-04 DIAGNOSIS — D2272 Melanocytic nevi of left lower limb, including hip: Secondary | ICD-10-CM | POA: Diagnosis not present

## 2022-10-04 DIAGNOSIS — L578 Other skin changes due to chronic exposure to nonionizing radiation: Secondary | ICD-10-CM | POA: Diagnosis not present

## 2022-10-04 DIAGNOSIS — L814 Other melanin hyperpigmentation: Secondary | ICD-10-CM | POA: Diagnosis not present

## 2022-12-07 DIAGNOSIS — Z961 Presence of intraocular lens: Secondary | ICD-10-CM | POA: Diagnosis not present

## 2022-12-07 DIAGNOSIS — E119 Type 2 diabetes mellitus without complications: Secondary | ICD-10-CM | POA: Diagnosis not present

## 2022-12-07 DIAGNOSIS — H52203 Unspecified astigmatism, bilateral: Secondary | ICD-10-CM | POA: Diagnosis not present

## 2022-12-13 DIAGNOSIS — E119 Type 2 diabetes mellitus without complications: Secondary | ICD-10-CM | POA: Diagnosis not present

## 2023-01-05 DIAGNOSIS — H6192 Disorder of left external ear, unspecified: Secondary | ICD-10-CM | POA: Diagnosis not present

## 2023-01-05 DIAGNOSIS — H903 Sensorineural hearing loss, bilateral: Secondary | ICD-10-CM | POA: Diagnosis not present

## 2023-01-05 DIAGNOSIS — D2222 Melanocytic nevi of left ear and external auricular canal: Secondary | ICD-10-CM | POA: Diagnosis not present

## 2023-01-30 ENCOUNTER — Other Ambulatory Visit: Payer: Self-pay

## 2023-01-30 MED ORDER — NITROGLYCERIN 0.4 MG SL SUBL: SUBLINGUAL_TABLET | SUBLINGUAL | 2 refills | Status: DC

## 2023-01-31 DIAGNOSIS — L304 Erythema intertrigo: Secondary | ICD-10-CM | POA: Diagnosis not present

## 2023-02-09 DIAGNOSIS — H903 Sensorineural hearing loss, bilateral: Secondary | ICD-10-CM | POA: Diagnosis not present

## 2023-02-09 DIAGNOSIS — D2222 Melanocytic nevi of left ear and external auricular canal: Secondary | ICD-10-CM | POA: Diagnosis not present

## 2023-03-16 DIAGNOSIS — E1169 Type 2 diabetes mellitus with other specified complication: Secondary | ICD-10-CM | POA: Diagnosis not present

## 2023-03-16 DIAGNOSIS — I2510 Atherosclerotic heart disease of native coronary artery without angina pectoris: Secondary | ICD-10-CM | POA: Diagnosis not present

## 2023-03-16 DIAGNOSIS — E785 Hyperlipidemia, unspecified: Secondary | ICD-10-CM | POA: Diagnosis not present

## 2023-03-16 DIAGNOSIS — I10 Essential (primary) hypertension: Secondary | ICD-10-CM | POA: Diagnosis not present

## 2023-03-16 DIAGNOSIS — E119 Type 2 diabetes mellitus without complications: Secondary | ICD-10-CM | POA: Diagnosis not present

## 2023-05-05 ENCOUNTER — Other Ambulatory Visit: Payer: Self-pay | Admitting: Cardiology

## 2023-05-22 ENCOUNTER — Ambulatory Visit: Payer: PPO | Attending: Cardiology | Admitting: Cardiology

## 2023-05-22 ENCOUNTER — Encounter: Payer: Self-pay | Admitting: Cardiology

## 2023-05-22 VITALS — BP 150/78 | HR 64 | Ht 60.0 in | Wt 184.0 lb

## 2023-05-22 DIAGNOSIS — E785 Hyperlipidemia, unspecified: Secondary | ICD-10-CM

## 2023-05-22 DIAGNOSIS — I2510 Atherosclerotic heart disease of native coronary artery without angina pectoris: Secondary | ICD-10-CM | POA: Diagnosis not present

## 2023-05-22 MED ORDER — CARVEDILOL 3.125 MG PO TABS: 3.1250 mg | ORAL_TABLET | Freq: Two times a day (BID) | ORAL | 3 refills | Status: DC

## 2023-05-22 MED ORDER — REPATHA SURECLICK 140 MG/ML ~~LOC~~ SOAJ: 140.0000 mg | SUBCUTANEOUS | 3 refills | Status: DC

## 2023-05-22 MED ORDER — IRBESARTAN 150 MG PO TABS: 150.0000 mg | ORAL_TABLET | Freq: Every day | ORAL | 3 refills | Status: DC

## 2023-05-22 NOTE — Progress Notes (Signed)
Cardiology Office Note:  .   Date:  05/22/2023  ID:  Summer Hodge, DOB Jan 08, 1956, MRN [id number] PCP: Shon Hale, MD  [facility] Providers Cardiologist:  Donato Schultz, MD    History of Present Illness: .   Summer Hodge is a 67 y.o. female Discussed with the use of AI scribe software  History of Present Illness   The patient, with a history of coronary artery disease, diabetes, and hyperlipidemia, underwent cardiac catheterization with LAD and RCA stents placement in 2019. She has a strong family history of coronary artery disease, with two brothers dying from myocardial infarctions at ages 63 and 78. Prior to the stent placement, the patient experienced anginal symptoms, including a throat sensation, right arm pain, and dyspnea with stairs.  Since the stent placements, the patient reports occasional angina, described as twinges or a brief sensation of discomfort. These episodes are not related to exercise and are usually resolved with nitroglycerin. The patient also reports occasional fluttering sensations in the throat, which she wonders might be related to an arrhythmia.  The patient is currently on Repatha, a maximally tolerated statin, and Zetia for hyperlipidemia. She also takes aspirin, low dose carvedilol, and a low dose of Crestor (rosuvastatin). The patient's LDL cholesterol was 40 in January 2024, and her A1C was 7.3. Her creatinine was 0.6, indicating stable kidney function.  The patient retired from her job at [employer] and is enjoying her retirement. She participates in the [employer] exercise program a couple of days a week and uses a treadmill at home at least once a week. The patient's blood pressure was slightly elevated at the time of the consultation, but she reports lower readings at home.       Studies Reviewed: Marland Kitchen   EKG Interpretation Date/Time:  Monday May 22 2023 09:29:15 EDT Ventricular Rate:  64 PR Interval:  164 QRS  Duration:  90 QT Interval:  410 QTC Calculation: 422 R Axis:   43  Text Interpretation: Normal sinus rhythm Normal ECG When compared with ECG of 21-Dec-2017 01:42, T wave inversion no longer evident in Inferior leads T wave inversion no longer evident in Anterolateral leads Confirmed by Donato Schultz ([id number]) on 05/22/2023 9:43:45 AM    LABS LDL cholesterol: 40 (08/2022) HbA1c: 7.3 (08/2022) Creatinine: 0.6 (08/2022)  RADIOLOGY Carotid dopplers: Minimal plaque bilaterally (05/25/2021)  DIAGNOSTIC Cardiac catheterization: LAD and RCA stents placed (12/20/2017) Showed her images. EKG: Normal (05/22/2023) No longer TWI inf.   Risk Assessment/Calculations:           Physical Exam:   VS:  BP (!) 150/78 (BP Location: Left Arm, Patient Position: Sitting, Cuff Size: Large)   Pulse 64   Ht 5' (1.524 m)   Wt 184 lb (83.5 kg)   LMP  (LMP Unknown)   SpO2 96%   BMI 35.94 kg/m    Wt Readings from Last 3 Encounters:  05/22/23 184 lb (83.5 kg)  09/12/22 182 lb (82.6 kg)  05/19/22 181 lb (82.1 kg)    GEN: Well nourished, well developed in no acute distress NECK: No JVD; No carotid bruits CARDIAC: RRR, no murmurs, rubs, gallops RESPIRATORY:  Clear to auscultation without rales, wheezing or rhonchi  ABDOMEN: Soft, non-tender, non-distended EXTREMITIES:  No edema; No deformity   ASSESSMENT AND PLAN: .   Assessment and Plan     Coronary Artery Disease History of LAD and RCA stents placed in 2019. Reports occasional angina, not related to exercise, which resolves with  nitroglycerin. No progressive or worrisome symptoms. EKG shows no concerning changes. -Continue current medications:Aspirin, Carvedilol, Repatha, Zetia 10mg , and Rosuvastatin. -Encouraged to reach out if symptoms change or worsen.  Hyperlipidemia LDL cholesterol at 40 in January 2024, indicating good control with current regimen of Repatha, Zetia, and Rosuvastatin. -Continue current medications.  Diabetes A1C at 7.3,  slightly elevated but stable. -Continue current management and monitor A1C.  Hypertension Blood pressure slightly elevated at office visit, but patient reports lower readings at home. Currently on Irbesartan and Carvedilol. -Continue current medications. -Encouraged to monitor blood pressure at home.  General Health Maintenance -Encouraged to maintain physical activity, aiming for at least 30 minutes of walking per day. -Follow-up in 1 year, or sooner if any changes in symptoms.          Coronary Artery Disease History of LAD and RCA stents placed in 2019. Reports occasional angina, not related to exercise, which resolves with nitroglycerin. No progressive or worrisome symptoms. EKG shows no concerning changes. -Continue current medications:Aspirin, Carvedilol, Repatha, Zetia 10mg , and Rosuvastatin. -Encouraged to reach out if symptoms change or worsen.  Hyperlipidemia LDL cholesterol at 40 in January 2024, indicating good control with current regimen of Repatha, Zetia, and Rosuvastatin. -Continue current medications.  Diabetes A1C at 7.3, slightly elevated but stable. -Continue current management and monitor A1C.  Hypertension Blood pressure slightly elevated at office visit, but patient reports lower readings at home. Currently on Irbesartan and Carvedilol. -Continue current medications. -Encouraged to monitor blood pressure at home.  General Health Maintenance -Encouraged to maintain physical activity, aiming for at least 30 minutes of walking per day. -Follow-up in 1 year, or sooner if any changes in symptoms.              Signed, Donato Schultz, MD

## 2023-05-22 NOTE — Patient Instructions (Signed)
Medication Instructions:  The current medical regimen is effective;  continue present plan and medications.  *If you need a refill on your cardiac medications before your next appointment, please call your pharmac  Follow-Up: At [facility], you and your health needs are our priority.  As part of our continuing mission to provide you with exceptional heart care, we have created designated Provider Care Teams.  These Care Teams include your primary Cardiologist (physician) and Advanced Practice Providers (APPs -  Physician Assistants and Nurse Practitioners) who all work together to provide you with the care you need, when you need it.  We recommend signing up for the patient portal called "MyChart".  Sign up information is provided on this After Visit Summary.  MyChart is used to connect with patients for Virtual Visits (Telemedicine).  Patients are able to view lab/test results, encounter notes, upcoming appointments, etc.  Non-urgent messages can be sent to your provider as well.   To learn more about what you can do with MyChart, go to [email or web address].    Your next appointment:   1 year(s)  Provider:   Donato Schultz, MD

## 2023-06-28 ENCOUNTER — Other Ambulatory Visit: Payer: Self-pay | Admitting: Family Medicine

## 2023-06-28 DIAGNOSIS — Z1231 Encounter for screening mammogram for malignant neoplasm of breast: Secondary | ICD-10-CM

## 2023-07-13 ENCOUNTER — Ambulatory Visit
Admission: RE | Admit: 2023-07-13 | Discharge: 2023-07-13 | Disposition: A | Discharge status: Home / Self Care | Payer: PPO | Source: Ambulatory Visit | Attending: Family Medicine | Admitting: Family Medicine

## 2023-07-13 DIAGNOSIS — Z1231 Encounter for screening mammogram for malignant neoplasm of breast: Secondary | ICD-10-CM

## 2023-07-26 DIAGNOSIS — D2221 Melanocytic nevi of right ear and external auricular canal: Secondary | ICD-10-CM | POA: Diagnosis not present

## 2023-07-26 DIAGNOSIS — D485 Neoplasm of uncertain behavior of skin: Secondary | ICD-10-CM | POA: Diagnosis not present

## 2023-07-26 DIAGNOSIS — L820 Inflamed seborrheic keratosis: Secondary | ICD-10-CM | POA: Diagnosis not present

## 2023-07-26 DIAGNOSIS — L570 Actinic keratosis: Secondary | ICD-10-CM | POA: Diagnosis not present

## 2023-08-01 DIAGNOSIS — Z1212 Encounter for screening for malignant neoplasm of rectum: Secondary | ICD-10-CM | POA: Diagnosis not present

## 2023-09-18 ENCOUNTER — Other Ambulatory Visit: Payer: Self-pay | Admitting: Family Medicine

## 2023-09-18 DIAGNOSIS — M810 Age-related osteoporosis without current pathological fracture: Secondary | ICD-10-CM

## 2023-12-12 DIAGNOSIS — Z961 Presence of intraocular lens: Secondary | ICD-10-CM | POA: Diagnosis not present

## 2023-12-12 DIAGNOSIS — H52203 Unspecified astigmatism, bilateral: Secondary | ICD-10-CM | POA: Diagnosis not present

## 2023-12-12 DIAGNOSIS — E119 Type 2 diabetes mellitus without complications: Secondary | ICD-10-CM | POA: Diagnosis not present

## 2023-12-12 DIAGNOSIS — H43813 Vitreous degeneration, bilateral: Secondary | ICD-10-CM | POA: Diagnosis not present

## 2024-02-08 DIAGNOSIS — H6191 Disorder of right external ear, unspecified: Secondary | ICD-10-CM | POA: Diagnosis not present

## 2024-03-03 ENCOUNTER — Other Ambulatory Visit: Payer: Self-pay | Admitting: Cardiology

## 2024-03-13 DIAGNOSIS — I10 Essential (primary) hypertension: Secondary | ICD-10-CM | POA: Diagnosis not present

## 2024-03-13 DIAGNOSIS — K5090 Crohn's disease, unspecified, without complications: Secondary | ICD-10-CM | POA: Diagnosis not present

## 2024-03-13 DIAGNOSIS — E119 Type 2 diabetes mellitus without complications: Secondary | ICD-10-CM | POA: Diagnosis not present

## 2024-03-13 DIAGNOSIS — E785 Hyperlipidemia, unspecified: Secondary | ICD-10-CM | POA: Diagnosis not present

## 2024-05-02 ENCOUNTER — Other Ambulatory Visit: Payer: Self-pay | Admitting: Cardiology

## 2024-05-02 DIAGNOSIS — I2510 Atherosclerotic heart disease of native coronary artery without angina pectoris: Secondary | ICD-10-CM

## 2024-05-02 DIAGNOSIS — E785 Hyperlipidemia, unspecified: Secondary | ICD-10-CM

## 2024-05-02 MED ORDER — CARVEDILOL 3.125 MG PO TABS: 3.1250 mg | ORAL_TABLET | Freq: Two times a day (BID) | ORAL | 0 refills | Status: DC

## 2024-05-02 NOTE — Telephone Encounter (Signed)
 Pt's medication was sent to pt's pharmacy as requested. Confirmation received.

## 2024-05-02 NOTE — Telephone Encounter (Signed)
*  STAT* If patient is at the pharmacy, call can be transferred to refill team.   1. Which medications need to be refilled? (please list name of each medication and dose if known) carvedilol  (COREG ) 3.125 MG tablet  Evolocumab  (REPATHA  SURECLICK) 140 MG/ML SOAJ   2. Would you like to learn more about the convenience, safety, & potential cost savings by using the [facility]? No   3. Are you open to using the [facility] (Type [facility]. ) No   4. Which pharmacy/location (including street and city if local pharmacy) is medication to be sent to?  [facility] [place], [place] - [address]   5. Do they need a 30 day or 90 day supply? 90 day and 6 for Repatha

## 2024-05-03 MED ORDER — REPATHA SURECLICK 140 MG/ML ~~LOC~~ SOAJ: 140.0000 mg | SUBCUTANEOUS | 3 refills | Status: AC

## 2024-05-06 ENCOUNTER — Other Ambulatory Visit: Payer: Self-pay | Admitting: Cardiology

## 2024-05-06 MED ORDER — NITROGLYCERIN 0.4 MG SL SUBL: SUBLINGUAL_TABLET | SUBLINGUAL | 2 refills | Status: AC

## 2024-05-16 ENCOUNTER — Encounter: Payer: Self-pay | Admitting: Cardiology

## 2024-05-16 ENCOUNTER — Ambulatory Visit: Attending: Cardiology | Admitting: Cardiology

## 2024-05-16 VITALS — BP 142/90 | HR 69 | Ht 60.0 in | Wt 186.1 lb

## 2024-05-16 DIAGNOSIS — E785 Hyperlipidemia, unspecified: Secondary | ICD-10-CM | POA: Diagnosis not present

## 2024-05-16 DIAGNOSIS — I2510 Atherosclerotic heart disease of native coronary artery without angina pectoris: Secondary | ICD-10-CM

## 2024-05-16 DIAGNOSIS — Z95820 Peripheral vascular angioplasty status with implants and grafts: Secondary | ICD-10-CM

## 2024-05-16 MED ORDER — CARVEDILOL 3.125 MG PO TABS: 3.1250 mg | ORAL_TABLET | Freq: Two times a day (BID) | ORAL | 3 refills | Status: AC

## 2024-05-16 MED ORDER — ISOSORBIDE MONONITRATE ER 30 MG PO TB24: 30.0000 mg | ORAL_TABLET | Freq: Every day | ORAL | 3 refills | Status: DC

## 2024-05-16 NOTE — Patient Instructions (Signed)
 Medication Instructions:  Your physician has recommended you make the following change in your medication:  1) START isosorbide  mononitrate (Imdur ) 30 mg daily  *If you need a refill on your cardiac medications before your next appointment, please call your pharmacy*  Follow-Up: At [facility], you and your health needs are our priority.  As part of our continuing mission to provide you with exceptional heart care, our providers are all part of one team.  This team includes your primary Cardiologist (physician) and Advanced Practice Providers or APPs (Physician Assistants and Nurse Practitioners) who all work together to provide you with the care you need, when you need it.  Your next appointment:   1 year(s)  Provider:   Oneil Parchment, MD  We recommend signing up for the patient portal called MyChart.  Sign up information is provided on this After Visit Summary.  MyChart is used to connect with patients for Virtual Visits (Telemedicine).  Patients are able to view lab/test results, encounter notes, upcoming appointments, etc.  Non-urgent messages can be sent to your provider as well.    To learn more about what you can do with MyChart, go to [email or web address].

## 2024-05-16 NOTE — Progress Notes (Signed)
 Cardiology Office Note:  .   Date:  05/16/2024  ID:  JALEEYA MCNELLY, DOB [birth year], MRN [id number] PCP: Chrystal Lamarr RAMAN, MD  [facility] Providers Cardiologist:  Oneil Parchment, MD     History of Present Illness: .   Summer Hodge is a 68 y.o. female Discussed the use of AI scribe software   History of Present Illness Summer Hodge is a 68 year old female with coronary artery disease who presents with occasional angina.  She experiences occasional angina, characterized by chest discomfort, a throat sensation, right arm pain, and dyspnea. These symptoms occur sporadically and are relieved by nitroglycerin . She has experienced chest discomfort during exercise classes.  She reports occasional arrhythmias, described as a fluttering sensation and heart pounding, sometimes accompanied by dizziness. These episodes are brief and not unusual for her.  Her past medical history includes a cardiac catheterization in May 2019, which showed patent LAD and RCA stents. She has a strong family history of coronary artery disease, with two brothers who died from myocardial infarctions at ages 72 and 60. In 2022, carotid dopplers showed minimal plaque bilaterally.  She is currently on Repatha  twice monthly, Zetia  10 mg daily, Crestor  10 mg daily, aspirin  81 mg daily, carvedilol  3.125 mg twice daily, and irbesartan  150 mg daily. Her LDL was 17 mg/dL as of September 12, 2023.  She is retired from [employer] and participates in [employer] exercise classes. She is concerned about her blood sugar levels, noting that her A1c was 8.3. No fevers, chills, nausea, or vomiting.     Studies Reviewed: SABRA   EKG Interpretation Date/Time:  Thursday May 16 2024 08:57:43 EDT Ventricular Rate:  69 PR Interval:  174 QRS Duration:  86 QT Interval:  416 QTC Calculation: 445 R Axis:   27  Text Interpretation: Normal sinus rhythm Normal ECG When compared with ECG of 22-May-2023 09:29, No  significant change was found Confirmed by Parchment Oneil ([id number]) on 05/16/2024 9:07:46 AM    Results LABS LDL: 17 (09/12/2023) Hemoglobin: 14.3 Creatinine: 0.8 ALT: 16 A1c: 8.3  RADIOLOGY Carotid Doppler: Minimal plaque bilaterally (2022)  DIAGNOSTIC Cardiac catheterization: Patent LAD and RCA stents (12/20/2017) ECG: No T wave inversion Risk Assessment/Calculations:           Physical Exam:   VS:  BP (!) 142/90   Pulse 69   Ht 5' (1.524 m)   Wt 186 lb 1.6 oz (84.4 kg)   LMP  (LMP Unknown)   SpO2 96%   BMI 36.35 kg/m    Wt Readings from Last 3 Encounters:  05/16/24 186 lb 1.6 oz (84.4 kg)  05/22/23 184 lb (83.5 kg)  09/12/22 182 lb (82.6 kg)    GEN: Well nourished, well developed in no acute distress NECK: No JVD; No carotid bruits CARDIAC: RRR, no murmurs, no rubs, no gallops RESPIRATORY:  Clear to auscultation without rales, wheezing or rhonchi  ABDOMEN: Soft, non-tender, non-distended EXTREMITIES:  No edema; No deformity   ASSESSMENT AND PLAN: .    Assessment and Plan Assessment & Plan Coronary artery disease with angina Intermittent angina with occasional arrhythmia and dizziness. Symptoms include chest discomfort, throat sensation, right arm pain, and dyspnea, relieved by nitroglycerin . Recent episodes during exercise. EKG shows no T wave inversion. - Prescribe isosorbide  30 mg for daily use to manage angina symptoms, noting potential headache side effect. - Advise continued use of nitroglycerin  as needed for acute angina. - Consider stress test if symptoms worsen.  Hyperlipidemia  LDL cholesterol well-controlled at 17 mg/dL with Repatha , Crestor , and Zetia . Statins significantly reduce risk of heart attack and death. - Continue Repatha , Crestor , and Zetia  as prescribed.  Type 2 diabetes mellitus A1c is 8.3, indicating suboptimal glucose control. Statins may slightly increase blood sugar, but cardiovascular benefits outweigh this risk. Discussed potential use  of GLP-1 receptor agonists like Ozempic, but cost is a barrier. - Encourage better blood glucose control. - Discuss potential use of GLP-1 receptor agonists if cost becomes manageable.  Hypertension Blood pressure slightly elevated, possibly due to white coat syndrome. Current medications include carvedilol  and irbesartan . - Monitor blood pressure at home periodically. - Continue carvedilol  and irbesartan  as prescribed.         Dispo: 1 yr  Signed, Oneil Parchment, MD

## 2024-05-21 ENCOUNTER — Encounter: Payer: Self-pay | Admitting: Cardiology

## 2024-05-23 ENCOUNTER — Other Ambulatory Visit: Payer: Self-pay | Admitting: Cardiology

## 2024-06-14 DIAGNOSIS — E119 Type 2 diabetes mellitus without complications: Secondary | ICD-10-CM | POA: Diagnosis not present

## 2024-06-28 DIAGNOSIS — E1169 Type 2 diabetes mellitus with other specified complication: Secondary | ICD-10-CM | POA: Diagnosis not present

## 2024-06-28 DIAGNOSIS — I10 Essential (primary) hypertension: Secondary | ICD-10-CM | POA: Diagnosis not present

## 2024-06-28 DIAGNOSIS — Z6835 Body mass index (BMI) 35.0-35.9, adult: Secondary | ICD-10-CM | POA: Diagnosis not present

## 2024-07-25 DIAGNOSIS — Z1231 Encounter for screening mammogram for malignant neoplasm of breast: Secondary | ICD-10-CM | POA: Diagnosis not present

## 2024-07-25 DIAGNOSIS — R92313 Mammographic fatty tissue density, bilateral breasts: Secondary | ICD-10-CM | POA: Diagnosis not present

## 2024-08-05 ENCOUNTER — Other Ambulatory Visit: Payer: Self-pay | Admitting: Cardiology
